# Patient Record
Sex: Female | Born: 1970
Health system: Southern US, Community
[De-identification: ages and names within clinical notes are randomized; demographics above are authoritative.]

## PROBLEM LIST (undated history)

## (undated) DIAGNOSIS — E079 Disorder of thyroid, unspecified: Secondary | ICD-10-CM

---

## 2000-08-31 ENCOUNTER — Other Ambulatory Visit: Admission: RE | Admit: 2000-08-31 | Discharge: 2000-08-31 | Payer: Self-pay | Admitting: *Deleted

## 2001-09-03 ENCOUNTER — Other Ambulatory Visit: Admission: RE | Admit: 2001-09-03 | Discharge: 2001-09-03 | Payer: Self-pay | Admitting: *Deleted

## 2001-10-05 ENCOUNTER — Encounter: Payer: Self-pay | Admitting: Obstetrics and Gynecology

## 2001-10-05 ENCOUNTER — Inpatient Hospital Stay (HOSPITAL_COMMUNITY): Admission: AD | Admit: 2001-10-05 | Discharge: 2001-10-05 | Payer: Self-pay | Admitting: Obstetrics and Gynecology

## 2001-10-06 ENCOUNTER — Encounter (INDEPENDENT_AMBULATORY_CARE_PROVIDER_SITE_OTHER): Payer: Self-pay

## 2001-10-06 ENCOUNTER — Ambulatory Visit (HOSPITAL_COMMUNITY): Admission: AD | Admit: 2001-10-06 | Discharge: 2001-10-06 | Payer: Self-pay | Admitting: Obstetrics and Gynecology

## 2002-04-19 ENCOUNTER — Encounter: Payer: Self-pay | Admitting: Obstetrics and Gynecology

## 2002-04-19 ENCOUNTER — Encounter: Admission: RE | Admit: 2002-04-19 | Discharge: 2002-04-19 | Payer: Self-pay | Admitting: Obstetrics and Gynecology

## 2002-10-08 ENCOUNTER — Encounter: Payer: Self-pay | Admitting: Obstetrics and Gynecology

## 2002-10-08 ENCOUNTER — Ambulatory Visit (HOSPITAL_COMMUNITY): Admission: RE | Admit: 2002-10-08 | Discharge: 2002-10-08 | Payer: Self-pay | Admitting: Obstetrics and Gynecology

## 2002-10-08 ENCOUNTER — Other Ambulatory Visit: Admission: RE | Admit: 2002-10-08 | Discharge: 2002-10-08 | Payer: Self-pay | Admitting: Obstetrics and Gynecology

## 2002-12-26 ENCOUNTER — Inpatient Hospital Stay (HOSPITAL_COMMUNITY): Admission: AD | Admit: 2002-12-26 | Discharge: 2002-12-26 | Payer: Self-pay | Admitting: Obstetrics and Gynecology

## 2003-01-30 ENCOUNTER — Ambulatory Visit (HOSPITAL_COMMUNITY): Admission: RE | Admit: 2003-01-30 | Discharge: 2003-01-30 | Payer: Self-pay | Admitting: Obstetrics and Gynecology

## 2003-01-30 ENCOUNTER — Encounter (INDEPENDENT_AMBULATORY_CARE_PROVIDER_SITE_OTHER): Payer: Self-pay | Admitting: Specialist

## 2003-12-02 ENCOUNTER — Other Ambulatory Visit: Admission: RE | Admit: 2003-12-02 | Discharge: 2003-12-02 | Payer: Self-pay | Admitting: Obstetrics and Gynecology

## 2004-05-18 ENCOUNTER — Other Ambulatory Visit: Admission: RE | Admit: 2004-05-18 | Discharge: 2004-05-18 | Payer: Self-pay | Admitting: Obstetrics and Gynecology

## 2004-09-21 ENCOUNTER — Ambulatory Visit (HOSPITAL_COMMUNITY): Admission: RE | Admit: 2004-09-21 | Discharge: 2004-09-21 | Payer: Self-pay | Admitting: Obstetrics and Gynecology

## 2004-10-18 ENCOUNTER — Inpatient Hospital Stay (HOSPITAL_COMMUNITY): Admission: AD | Admit: 2004-10-18 | Discharge: 2004-10-21 | Payer: Self-pay | Admitting: Obstetrics and Gynecology

## 2004-10-19 ENCOUNTER — Ambulatory Visit: Payer: Self-pay | Admitting: *Deleted

## 2004-10-19 ENCOUNTER — Encounter (INDEPENDENT_AMBULATORY_CARE_PROVIDER_SITE_OTHER): Payer: Self-pay | Admitting: Specialist

## 2004-10-22 ENCOUNTER — Encounter: Admission: RE | Admit: 2004-10-22 | Discharge: 2004-11-20 | Payer: Self-pay | Admitting: Obstetrics and Gynecology

## 2004-11-21 ENCOUNTER — Encounter: Admission: RE | Admit: 2004-11-21 | Discharge: 2004-12-21 | Payer: Self-pay | Admitting: Obstetrics and Gynecology

## 2004-11-29 ENCOUNTER — Other Ambulatory Visit: Admission: RE | Admit: 2004-11-29 | Discharge: 2004-11-29 | Payer: Self-pay | Admitting: Obstetrics and Gynecology

## 2004-12-22 ENCOUNTER — Encounter: Admission: RE | Admit: 2004-12-22 | Discharge: 2005-01-20 | Payer: Self-pay | Admitting: Obstetrics and Gynecology

## 2005-01-21 ENCOUNTER — Encounter: Admission: RE | Admit: 2005-01-21 | Discharge: 2005-02-20 | Payer: Self-pay | Admitting: Obstetrics and Gynecology

## 2005-02-21 ENCOUNTER — Encounter: Admission: RE | Admit: 2005-02-21 | Discharge: 2005-03-23 | Payer: Self-pay | Admitting: Obstetrics and Gynecology

## 2005-03-24 ENCOUNTER — Encounter: Admission: RE | Admit: 2005-03-24 | Discharge: 2005-04-20 | Payer: Self-pay | Admitting: Obstetrics and Gynecology

## 2005-04-21 ENCOUNTER — Encounter: Admission: RE | Admit: 2005-04-21 | Discharge: 2005-05-21 | Payer: Self-pay | Admitting: Obstetrics and Gynecology

## 2005-05-22 ENCOUNTER — Encounter: Admission: RE | Admit: 2005-05-22 | Discharge: 2005-06-07 | Payer: Self-pay | Admitting: Obstetrics and Gynecology

## 2006-08-16 ENCOUNTER — Encounter: Admission: RE | Admit: 2006-08-16 | Discharge: 2006-08-16 | Payer: Self-pay | Admitting: Obstetrics and Gynecology

## 2006-10-06 ENCOUNTER — Encounter: Admission: RE | Admit: 2006-10-06 | Discharge: 2006-10-06 | Payer: Self-pay | Admitting: Obstetrics and Gynecology

## 2007-04-10 ENCOUNTER — Ambulatory Visit (HOSPITAL_COMMUNITY): Admission: RE | Admit: 2007-04-10 | Discharge: 2007-04-10 | Payer: Self-pay | Admitting: Obstetrics and Gynecology

## 2007-12-20 ENCOUNTER — Inpatient Hospital Stay (HOSPITAL_COMMUNITY): Admission: AD | Admit: 2007-12-20 | Discharge: 2007-12-22 | Payer: Self-pay | Admitting: Obstetrics and Gynecology

## 2007-12-24 ENCOUNTER — Encounter: Admission: RE | Admit: 2007-12-24 | Discharge: 2008-01-22 | Payer: Self-pay | Admitting: Obstetrics and Gynecology

## 2007-12-26 ENCOUNTER — Ambulatory Visit: Admission: RE | Admit: 2007-12-26 | Discharge: 2007-12-26 | Payer: Self-pay | Admitting: Obstetrics and Gynecology

## 2008-01-23 ENCOUNTER — Encounter: Admission: RE | Admit: 2008-01-23 | Discharge: 2008-02-22 | Payer: Self-pay | Admitting: Obstetrics and Gynecology

## 2008-02-23 ENCOUNTER — Encounter: Admission: RE | Admit: 2008-02-23 | Discharge: 2008-03-24 | Payer: Self-pay | Admitting: Obstetrics and Gynecology

## 2008-03-25 ENCOUNTER — Encounter: Admission: RE | Admit: 2008-03-25 | Discharge: 2008-04-21 | Payer: Self-pay | Admitting: Obstetrics and Gynecology

## 2008-04-22 ENCOUNTER — Encounter: Admission: RE | Admit: 2008-04-22 | Discharge: 2008-05-08 | Payer: Self-pay | Admitting: Obstetrics and Gynecology

## 2010-06-18 NOTE — H&P (Signed)
NAME:  Desiree Pham, Desiree Pham                         ACCOUNT NO.:  [id number]   MEDICAL RECORD NO.:  [id number]                   PATIENT TYPE:  AMB   LOCATION:  SDC                                  FACILITY:  [facility]   PHYSICIAN:  Juluis Mire, M.D.                DATE OF BIRTH:  Aug 03, 1970   DATE OF ADMISSION:  DATE OF DISCHARGE:                                HISTORY & PHYSICAL   The patient is a 40 year old gravida 2, para 0, abortus 1, married white  female whose last menstrual period was October 20, giving her an estimated  gestational age of [redacted] weeks.  Presents for D&E for management of nonviable  first trimester pregnancy.   In relation to the present admission, the patient reported increasing  spotting.  Was seen in the office on __________ 29, where ultrasound  revealed an intrauterine fetal pole consistent with 6 weeks 3 days.  No  heart activity was seen, consistent with a nonviable first trimester  pregnancy.  The patient now presents for D&E for management.   In terms of allergies, she is allergic to CODEINE, which causes nausea and  vomiting.   MEDICATIONS:  Prenatal vitamins.   PRENATAL LABORATORY DATA:  She is A negative.  RhoGAM will be required.  It  is of note, she has had some bleeding and received RhoGAM, evidently over  Thanksgiving.  We will have to check the titers.   PAST MEDICAL HISTORY:  Usual childhood diseases with no significant  sequelae.   PAST SURGICAL HISTORY:  She has had a previous D&E in September 2003 for  nonviable pregnancy.   FAMILY HISTORY:  Noncontributory.   SOCIAL HISTORY:  No tobacco or alcohol use.   REVIEW OF SYSTEMS:  Noncontributory.   PHYSICAL EXAMINATION:  VITAL SIGNS:  The patient is afebrile with stable.  vital signs.  HEENT:  Patient is normocephalic.  Pupils equal, round, and reactive to  light and accommodation.  Extraocular movements were intact.  Sclerae and  conjunctivae clear.  Oropharynx clear.  NECK:  Without  thyromegaly.  BREASTS:  No discrete masses.  CHEST:  Lungs clear.  CARDIAC:  Regular rhythm and rate without murmurs or gallops.  ABDOMEN:  Benign.  PELVIC:  Normal external genitalia.  Vaginal mucosa is clear.  Mild bleeding  is noted.  Uterus approximately eight weeks in size, adnexa unremarkable.  EXTREMITIES:  Trace edema.  NEUROLOGIC:  Grossly within normal limits.   IMPRESSION:  Nonviable first trimester pregnancy.   PLAN:  The patient will undergo dilatation and evacuation.  Risks of surgery  have been discussed, including the risk of infection, the risk of continued  bleeding that could require repeat D&C, risk of hemorrhage that could  require transfusion or possible hysterectomy, risk of uterine perforation  that could lead to injury to adjacent organs requiring diagnostic  laparoscopy with possible exploratory laparotomy, the risk of deep venous  thrombosis and pulmonary embolus.  The patient expressed an understanding of  the indications and risks.  We will send tissue for genetic studies.                                               Juluis Mire, M.D.    JSM/MEDQ  D:  01/30/2003  T:  01/30/2003  Job:  [id number]

## 2010-06-18 NOTE — Op Note (Signed)
NAMESHATORIA, STOOKSBURY               ACCOUNT NO.:  [id number]   MEDICAL RECORD NO.:  [id number]          PATIENT TYPE:  INP   LOCATION:  9319                          FACILITY:  [facility]   PHYSICIAN:  Duke Salvia. Marcelle Overlie, M.D.DATE OF BIRTH:  Jul 16, 1970   DATE OF PROCEDURE:  10/19/2004  DATE OF DISCHARGE:                                 OPERATIVE REPORT   DELIVERY NOTE:  This patient is a 31+ weeks, PPROM, laboring patient.  I was  called to delivery because not only was she starting the crown but also  because of some variable decelerations with intermittent recovery.  After  prepping and draping, her catheter had been recently removed, was noted to  be straight OA.  You could see the scalp easily without spreading the labia.  Because of the variable decelerations, a decision made to proceed with V-  assisted delivery.  The Kiwi was applied and moderate traction effort  coordinated with internal pushing x1 effort only to effect easy delivery of  a female, the infant was suctioned, the cord was noted to be around the leg  twice.  The infant was suctioned, cord clamped, and passed to the NICU team,  which was in attendance.  Cord pH was 7.15.  The placenta was delivered  spontaneously intact and was sent to pathology.  Pitocin was given at that  point.  EBL 350.  There was a superficial second degree perineal laceration  that was repaired with 3-0 Vicryl Rapide suture.  Mother and baby doing well  at that point, Apgars were 7 and 7.      Richard M. Marcelle Overlie, M.D.  Electronically Signed     RMH/MEDQ  D:  10/19/2004  T:  10/20/2004  Job:  [id number]

## 2010-11-02 LAB — CBC
MCHC: 33.6
Platelets: 162
RDW: 13.6
WBC: 14.4 — ABNORMAL HIGH

## 2010-11-02 LAB — RH IMMUNE GLOB WKUP(>/=20WKS)(NOT WOMEN'S HOSP)

## 2010-11-02 LAB — RPR: RPR Ser Ql: NONREACTIVE

## 2012-03-07 ENCOUNTER — Other Ambulatory Visit: Payer: Self-pay | Admitting: Obstetrics and Gynecology

## 2012-08-04 ENCOUNTER — Emergency Department (HOSPITAL_COMMUNITY)
Admission: EM | Admit: 2012-08-04 | Discharge: 2012-08-04 | Disposition: A | Discharge status: Home / Self Care | Payer: BC Managed Care – PPO | Attending: Emergency Medicine | Admitting: Emergency Medicine

## 2012-08-04 ENCOUNTER — Encounter (HOSPITAL_COMMUNITY): Payer: Self-pay | Admitting: Emergency Medicine

## 2012-08-04 DIAGNOSIS — Z23 Encounter for immunization: Secondary | ICD-10-CM | POA: Insufficient documentation

## 2012-08-04 DIAGNOSIS — Y92410 Unspecified street and highway as the place of occurrence of the external cause: Secondary | ICD-10-CM | POA: Insufficient documentation

## 2012-08-04 DIAGNOSIS — E079 Disorder of thyroid, unspecified: Secondary | ICD-10-CM | POA: Insufficient documentation

## 2012-08-04 DIAGNOSIS — S81009A Unspecified open wound, unspecified knee, initial encounter: Secondary | ICD-10-CM | POA: Insufficient documentation

## 2012-08-04 DIAGNOSIS — Z79899 Other long term (current) drug therapy: Secondary | ICD-10-CM | POA: Insufficient documentation

## 2012-08-04 DIAGNOSIS — W540XXA Bitten by dog, initial encounter: Secondary | ICD-10-CM | POA: Insufficient documentation

## 2012-08-04 DIAGNOSIS — Y9301 Activity, walking, marching and hiking: Secondary | ICD-10-CM | POA: Insufficient documentation

## 2012-08-04 DIAGNOSIS — S81851A Open bite, right lower leg, initial encounter: Secondary | ICD-10-CM

## 2012-08-04 HISTORY — DX: Disorder of thyroid, unspecified: E07.9

## 2012-08-04 MED ORDER — TRAMADOL HCL 50 MG PO TABS
100.0000 mg | ORAL_TABLET | Freq: Once | ORAL | Status: AC
  Administered 2012-08-04: 100 mg via ORAL
  Filled 2012-08-04: qty 2

## 2012-08-04 MED ORDER — ACETAMINOPHEN 325 MG PO TABS
650.0000 mg | ORAL_TABLET | Freq: Once | ORAL | Status: AC
  Administered 2012-08-04: 650 mg via ORAL
  Filled 2012-08-04: qty 2

## 2012-08-04 MED ORDER — TRAMADOL HCL 50 MG PO TABS: 50.0000 mg | ORAL_TABLET | Freq: Four times a day (QID) | ORAL | Status: DC | PRN

## 2012-08-04 MED ORDER — RABIES VACCINE, PCEC IM SUSR
1.0000 mL | Freq: Once | INTRAMUSCULAR | Status: AC
  Administered 2012-08-04: 1 mL via INTRAMUSCULAR
  Filled 2012-08-04: qty 1

## 2012-08-04 MED ORDER — TRAMADOL HCL 50 MG PO TABS: 50.0000 mg | ORAL_TABLET | Freq: Once | ORAL | Status: DC

## 2012-08-04 MED ORDER — RABIES IMMUNE GLOBULIN 150 UNIT/ML IM INJ
20.0000 [IU]/kg | INJECTION | Freq: Once | INTRAMUSCULAR | Status: AC
  Administered 2012-08-04: 1050 [IU]
  Filled 2012-08-04: qty 8

## 2012-08-04 NOTE — ED Provider Notes (Signed)
   History    CSN: [id number] Arrival date & time 08/04/12  1321  First MD Initiated Contact with Patient 08/04/12 1330     Chief Complaint  Patient presents with  . Animal Bite   (Consider location/radiation/quality/duration/timing/severity/associated sxs/prior Treatment) The history is provided by the patient and medical records.   Pt presents to the ED for a dog bite on her right lower leg.  Pt states she went for a walk this morning and was bitten by a dog that she thinks belongs to someone on her street but is unsure.  Dogs vaccination status is unknown.  Animal control was contacted and they are looking for the dog.   Pt was seen at [facility] and given a tetanus shot and prescription for augmentin.  Sent to the ED for rabies vaccinations.  Wound bandaged and bleeding well controlled on arrival.  Past Medical History  Diagnosis Date  . Thyroid disease    History reviewed. No pertinent past surgical history. No family history on file. History  Substance Use Topics  . Smoking status: Not on file  . Smokeless tobacco: Not on file  . Alcohol Use: Yes   OB History   Grav Para Term Preterm Abortions TAB SAB Ect Mult Living                 Review of Systems  Skin: Positive for wound.  All other systems reviewed and are negative.    Allergies  Review of patient's allergies indicates not on file.  Home Medications  No current outpatient prescriptions on file. BP 121/54  Pulse 62  Temp(Src) 97.9 F (36.6 C) (Oral)  Resp 20  SpO2 97%  LMP 07/19/2012  Physical Exam  Nursing note and vitals reviewed. Constitutional: She is oriented to person, place, and time. She appears well-developed and well-nourished.  HENT:  Head: Normocephalic and atraumatic.  Eyes: Conjunctivae and EOM are normal.  Neck: Normal range of motion. Neck supple.  Cardiovascular: Normal rate, regular rhythm, normal heart sounds and intact distal pulses.   Pulmonary/Chest: Effort normal and breath sounds  normal. No respiratory distress. She has no wheezes.  Musculoskeletal: Normal range of motion.       Legs: Neurological: She is alert and oriented to person, place, and time.  Skin: Skin is warm and dry. Laceration noted.  Psychiatric: She has a normal mood and affect.    ED Course  Procedures (including critical care time) Labs Reviewed - No data to display No results found.  1. Dog bite of lower leg, right, initial encounter   2. Need for immunization against rabies     MDM   Tetanus UTD (given at UC this morning).  Rabies vaccine and immune globulin given.  Pt given schedule for subsequent rabies vaccinations and FU for [facility] urgent care-- Advised if she does not complete series they may not be fully effective.  Instructed to take augmentin given to her at [facility] this morning.  Continue home wound care and monitor for signs of infection.  Rx tramadol.  Discussed plan with pt, she agreed.  Return precautions advised.  Garlon Hatchet, PA-C 08/04/12 1549

## 2012-08-04 NOTE — ED Notes (Signed)
Animal control called and will get in touch with the owner and will pick up dog tomorrow.

## 2012-08-04 NOTE — ED Provider Notes (Signed)
Medical screening examination/treatment/procedure(s) were performed by non-physician practitioner and as supervising physician I was immediately available for consultation/collaboration.   Kasie Leccese L Merline Perkin, MD 08/04/12 2047 

## 2012-08-04 NOTE — ED Notes (Signed)
Pt. Stated, I got biten by a dog this morning, went to UC and given a tetanus shot and told to come here for further evaluation.

## 2012-08-06 ENCOUNTER — Telehealth (HOSPITAL_COMMUNITY): Payer: Self-pay | Admitting: *Deleted

## 2012-08-06 NOTE — ED Notes (Signed)
Pt. called about scheduling her rabies shot. I told her we don't schedule any longer because we don't have a designated rabies Engineer, civil (consulting). I gave her the hours of operation and told her to come on the days she is scheduled. Day 3, 7 and 14. She said she was told to only come on day 7 and 14. I told her to come tomorrow and bring her d/c instructions. Kim RN called the flow manager for her rabies schedule. Vassie Moselle 08/06/2012

## 2012-08-06 NOTE — ED Notes (Signed)
I reviewed pt.'s d/c instructions and left message for pt. to call. The correct schedule is under vaccination after an exposure. Nancy Hertlein notified of pt.'s confusion about schedule. Vassie Moselle 08/06/2012

## 2012-08-07 ENCOUNTER — Emergency Department (HOSPITAL_COMMUNITY)
Admission: EM | Admit: 2012-08-07 | Discharge: 2012-08-07 | Disposition: A | Discharge status: Home / Self Care | Payer: BC Managed Care – PPO | Source: Home / Self Care

## 2012-08-07 ENCOUNTER — Encounter (HOSPITAL_COMMUNITY): Payer: Self-pay | Admitting: Emergency Medicine

## 2012-08-07 DIAGNOSIS — Z203 Contact with and (suspected) exposure to rabies: Secondary | ICD-10-CM

## 2012-08-07 MED ORDER — RABIES VACCINE, PCEC IM SUSR
INTRAMUSCULAR | Status: AC
  Filled 2012-08-07: qty 1

## 2012-08-07 MED ORDER — RABIES VACCINE, PCEC IM SUSR
1.0000 mL | Freq: Once | INTRAMUSCULAR | Status: AC
  Administered 2012-08-07: 1 mL via INTRAMUSCULAR

## 2012-08-07 NOTE — ED Notes (Signed)
Pt here for second rabies injection. Tolerated the first one well. No new problems. Pt is alert and oriened.

## 2012-08-11 ENCOUNTER — Emergency Department (HOSPITAL_COMMUNITY)
Admission: EM | Admit: 2012-08-11 | Discharge: 2012-08-11 | Disposition: A | Discharge status: Home / Self Care | Payer: BC Managed Care – PPO | Source: Home / Self Care

## 2012-08-11 ENCOUNTER — Encounter (HOSPITAL_COMMUNITY): Payer: Self-pay | Admitting: *Deleted

## 2012-08-11 DIAGNOSIS — Z203 Contact with and (suspected) exposure to rabies: Secondary | ICD-10-CM

## 2012-08-11 MED ORDER — RABIES VACCINE, PCEC IM SUSR
1.0000 mL | Freq: Once | INTRAMUSCULAR | Status: AC
  Administered 2012-08-11: 1 mL via INTRAMUSCULAR

## 2012-08-11 MED ORDER — RABIES VACCINE, PCEC IM SUSR
INTRAMUSCULAR | Status: AC
  Filled 2012-08-11: qty 1

## 2012-08-11 NOTE — ED Notes (Signed)
Presents for rabies injection.  No c/o's.

## 2012-08-11 NOTE — ED Notes (Signed)
Pt states she will be out of town for next dose - family member who is a physician is procuring vaccine and going to administer for pt.  Pt aware dose needs to be given on 7/19.

## 2012-11-07 ENCOUNTER — Telehealth (HOSPITAL_COMMUNITY): Payer: Self-pay | Admitting: *Deleted

## 2012-11-07 NOTE — ED Notes (Signed)
10/7  Pt. called and asked me to document that she had her 4 th rabies vaccine in [place].  I asked her if she had any documentation showing this.  She said yes. I asked her to fax it to me and I would see that it got on her record that she completed her rabies series.  10/8  Pt. faxed her record showing she got the rabies vaccine and a Tdap.  I added her medical record number to this and put it in the scan box. Vassie Moselle 11/07/2012

## 2013-03-14 ENCOUNTER — Other Ambulatory Visit: Payer: Self-pay | Admitting: Obstetrics and Gynecology

## 2013-10-30 ENCOUNTER — Ambulatory Visit: Payer: BC Managed Care – PPO | Attending: Family Medicine | Admitting: Physical Therapy

## 2013-10-30 DIAGNOSIS — R5381 Other malaise: Secondary | ICD-10-CM | POA: Diagnosis not present

## 2013-10-30 DIAGNOSIS — M25539 Pain in unspecified wrist: Secondary | ICD-10-CM | POA: Insufficient documentation

## 2013-10-30 DIAGNOSIS — IMO0001 Reserved for inherently not codable concepts without codable children: Secondary | ICD-10-CM | POA: Insufficient documentation

## 2013-11-06 ENCOUNTER — Ambulatory Visit: Payer: BC Managed Care – PPO | Attending: Family Medicine | Admitting: Physical Therapy

## 2013-11-06 DIAGNOSIS — M25521 Pain in right elbow: Secondary | ICD-10-CM | POA: Diagnosis not present

## 2013-11-06 DIAGNOSIS — Z5189 Encounter for other specified aftercare: Secondary | ICD-10-CM | POA: Insufficient documentation

## 2013-11-06 DIAGNOSIS — R5381 Other malaise: Secondary | ICD-10-CM | POA: Insufficient documentation

## 2013-11-07 ENCOUNTER — Ambulatory Visit: Payer: BC Managed Care – PPO | Admitting: Physical Therapy

## 2013-11-07 DIAGNOSIS — Z5189 Encounter for other specified aftercare: Secondary | ICD-10-CM | POA: Diagnosis not present

## 2013-11-11 ENCOUNTER — Ambulatory Visit: Payer: BC Managed Care – PPO | Admitting: Physical Therapy

## 2013-11-11 DIAGNOSIS — Z5189 Encounter for other specified aftercare: Secondary | ICD-10-CM | POA: Diagnosis not present

## 2013-11-14 ENCOUNTER — Ambulatory Visit: Payer: BC Managed Care – PPO | Admitting: Physical Therapy

## 2013-11-14 DIAGNOSIS — Z5189 Encounter for other specified aftercare: Secondary | ICD-10-CM | POA: Diagnosis not present

## 2013-11-18 ENCOUNTER — Ambulatory Visit: Payer: BC Managed Care – PPO | Admitting: Physical Therapy

## 2013-11-18 DIAGNOSIS — Z5189 Encounter for other specified aftercare: Secondary | ICD-10-CM | POA: Diagnosis not present

## 2013-11-21 ENCOUNTER — Ambulatory Visit: Payer: BC Managed Care – PPO | Admitting: Physical Therapy

## 2013-11-21 DIAGNOSIS — Z5189 Encounter for other specified aftercare: Secondary | ICD-10-CM | POA: Diagnosis not present

## 2013-11-25 ENCOUNTER — Encounter: Payer: BC Managed Care – PPO | Admitting: Physical Therapy

## 2013-11-26 ENCOUNTER — Ambulatory Visit: Payer: BC Managed Care – PPO | Admitting: Physical Therapy

## 2013-11-26 DIAGNOSIS — Z5189 Encounter for other specified aftercare: Secondary | ICD-10-CM | POA: Diagnosis not present

## 2013-11-28 ENCOUNTER — Encounter: Payer: BC Managed Care – PPO | Admitting: Physical Therapy

## 2013-12-02 ENCOUNTER — Ambulatory Visit: Payer: BC Managed Care – PPO | Admitting: Physical Therapy

## 2013-12-18 ENCOUNTER — Ambulatory Visit (INDEPENDENT_AMBULATORY_CARE_PROVIDER_SITE_OTHER): Payer: BC Managed Care – PPO | Admitting: Sports Medicine

## 2013-12-18 ENCOUNTER — Encounter: Payer: Self-pay | Admitting: Sports Medicine

## 2013-12-18 VITALS — BP 110/75 | HR 53 | Ht 66.0 in | Wt 109.0 lb

## 2013-12-18 DIAGNOSIS — M25521 Pain in right elbow: Secondary | ICD-10-CM

## 2013-12-18 DIAGNOSIS — M7711 Lateral epicondylitis, right elbow: Secondary | ICD-10-CM | POA: Diagnosis not present

## 2013-12-18 NOTE — Progress Notes (Signed)
Patient ID: Desiree SavoySamantha S Fatzinger, female   DOB: 07/29/1970, 43 y.o.   MRN: [id number]  Patient was referred courtesy of Dr. [place]az[place]  History of lateral elbow pain that was diagnosed as lateral epicondylitis. She has been to physical therapy and we have a summary of multiple sessions of therapy along with iontophoresis.  These give her temporary relief but she has not had any major improvement in the elbow since her symptoms started in May. She started getting symptoms in the elbow after she moved to a new house in [place] and was doing a lot of gardening.  Squeezing a clipper or using a trowel would cause elbow pain. Now she gets pain when holding a coffee cup, opening a door, picking up anything that weighs more than a few pounds with the right arm. She does some weight lifting exercise. However most of the exercises now are causing some pain in the elbow. Pushups also cause pain.  Medical issues, she is stable on treatment for hypothyroidism.  She does have a history of migraine headaches usually triggered by hormonal change.  They typically respond to Aleve and time.  Physical examination Thin female in no acute distress BP 110/75 mmHg  Pulse 53  Ht 5\' 6"  (1.676 m)  Wt 109 lb (49.442 kg)  BMI 17.60 kg/m2  Right elbow and right shoulder showed full range of motion Right elbow shows point tenderness at the insertion of the common extensor tendon on the lateral epicondyle There is no swelling There is no elbow hyperextension No redness  Resistance of the wrist to extension causes pain when the wrist is flexed Resistance of the fingers to extension cause less pain Full hand grip causes pain  Note she has increased redness over the fingers and MTP joints suggestive of Raynaud's  MSK ultrasound of the right elbow There is a small separation of tendon fibers from their insertion into the lateral epicondyle seen of the deep fibers of the common extensor tendon This is noted by hypoechoic  change and there is also calcification in the soft tissue The joint and radial head appear normal with no effusion or irregularity There is increased Doppler activity and neo-vessels seen in the common extensor tendon Findings are confirmed on longitudinal and transverse scans

## 2013-12-18 NOTE — Assessment & Plan Note (Signed)
She has failed a conservative course of treatment thus far  We will add a compression sleeve for her to use with activities Modify any weight lifting as this may be aggravating her condition  Arnica gel for topical application  Home exercise program to emphasize regaining eccentric strength Given a standard set of exercises  I would like to use the topical nitroglycerin but because of her history of migraine headaches she has a relative contraindication  We will recheck in 6 weeks and if not responding consider additional options

## 2014-01-13 ENCOUNTER — Emergency Department (HOSPITAL_BASED_OUTPATIENT_CLINIC_OR_DEPARTMENT_OTHER)
Admission: EM | Admit: 2014-01-13 | Discharge: 2014-01-13 | Disposition: A | Discharge status: Home / Self Care | Payer: BC Managed Care – PPO | Attending: Emergency Medicine | Admitting: Emergency Medicine

## 2014-01-13 ENCOUNTER — Emergency Department (HOSPITAL_BASED_OUTPATIENT_CLINIC_OR_DEPARTMENT_OTHER): Payer: BC Managed Care – PPO

## 2014-01-13 ENCOUNTER — Encounter (HOSPITAL_BASED_OUTPATIENT_CLINIC_OR_DEPARTMENT_OTHER): Payer: Self-pay

## 2014-01-13 DIAGNOSIS — S52552A Other extraarticular fracture of lower end of left radius, initial encounter for closed fracture: Secondary | ICD-10-CM | POA: Insufficient documentation

## 2014-01-13 DIAGNOSIS — Y9389 Activity, other specified: Secondary | ICD-10-CM | POA: Diagnosis not present

## 2014-01-13 DIAGNOSIS — Z791 Long term (current) use of non-steroidal anti-inflammatories (NSAID): Secondary | ICD-10-CM | POA: Diagnosis not present

## 2014-01-13 DIAGNOSIS — Y929 Unspecified place or not applicable: Secondary | ICD-10-CM | POA: Insufficient documentation

## 2014-01-13 DIAGNOSIS — Z79899 Other long term (current) drug therapy: Secondary | ICD-10-CM | POA: Diagnosis not present

## 2014-01-13 DIAGNOSIS — W19XXXA Unspecified fall, initial encounter: Secondary | ICD-10-CM

## 2014-01-13 DIAGNOSIS — S52502A Unspecified fracture of the lower end of left radius, initial encounter for closed fracture: Secondary | ICD-10-CM

## 2014-01-13 DIAGNOSIS — S6992XA Unspecified injury of left wrist, hand and finger(s), initial encounter: Secondary | ICD-10-CM | POA: Diagnosis present

## 2014-01-13 DIAGNOSIS — Y998 Other external cause status: Secondary | ICD-10-CM | POA: Diagnosis not present

## 2014-01-13 MED ORDER — NAPROXEN 500 MG PO TABS: 500.0000 mg | ORAL_TABLET | Freq: Two times a day (BID) | ORAL | Status: AC

## 2014-01-13 MED ORDER — TRAMADOL HCL 50 MG PO TABS: 50.0000 mg | ORAL_TABLET | Freq: Four times a day (QID) | ORAL | Status: AC | PRN

## 2014-01-13 NOTE — ED Provider Notes (Signed)
CSN: [id number]     Arrival date & time 01/13/14  0721 History   First MD Initiated Contact with Patient 01/13/14 [phone]     Chief Complaint  Patient presents with  . Wrist Pain     (Consider location/radiation/quality/duration/timing/severity/associated sxs/prior Treatment) Patient is a 43 y.o. female presenting with wrist pain. The history is provided by the patient.  Wrist Pain Pertinent negatives include no chest pain, no abdominal pain, no headaches and no shortness of breath.   status post fall off a scooter yesterday. With some abrasions to her right elbow and right hand but pain to the left wrist area that's been persistent. Was swelling. Pain 6 out of 10 sharp ache in nature. Patient denies any other injuries. Pain increases with movement of the left wrist. Patient's right hand dominant.  Past Medical History  Diagnosis Date  . Thyroid disease    History reviewed. No pertinent past surgical history. No family history on file. History  Substance Use Topics  . Smoking status: Never Smoker   . Smokeless tobacco: Not on file  . Alcohol Use: 0.0 oz/week    0 Not specified per week   OB History    No data available     Review of Systems  Constitutional: Negative for fever.  HENT: Negative for congestion.   Eyes: Negative for visual disturbance.  Respiratory: Negative for shortness of breath.   Cardiovascular: Negative for chest pain.  Gastrointestinal: Negative for abdominal pain.  Genitourinary: Negative for dysuria.  Musculoskeletal: Positive for joint swelling. Negative for back pain and neck pain.  Skin: Positive for wound. Negative for rash.  Neurological: Negative for headaches.  Hematological: Does not bruise/bleed easily.  Psychiatric/Behavioral: Negative for confusion.      Allergies  Codeine  Home Medications   Prior to Admission medications   Medication Sig Start Date End Date Taking? Authorizing Provider  5-Hydroxytryptophan (5-HTP PO) Take 1  tablet by mouth 2 (two) times daily.    Historical Provider, MD  CALCIUM PO Take 1 tablet by mouth daily.    Historical Provider, MD  fish oil-omega-3 fatty acids 1000 MG capsule Take 2 g by mouth daily.    Historical Provider, MD  levothyroxine (SYNTHROID, LEVOTHROID) 75 MCG tablet Take 75 mcg by mouth daily before breakfast.    Historical Provider, MD  MILK THISTLE PO Take 1 tablet by mouth daily.    Historical Provider, MD  minocycline (MINOCIN,DYNACIN) 100 MG capsule  09/26/13   Historical Provider, MD  Multiple Vitamins-Minerals (MULTIVITAMIN WITH MINERALS) tablet Take 1 tablet by mouth daily.    Historical Provider, MD  naproxen (NAPROSYN) 500 MG tablet Take 1 tablet (500 mg total) by mouth 2 (two) times daily. 01/13/14   Vanetta MuldersScott Torrance Frech, MD  PRESCRIPTION MEDICATION Take 1 [place] by mouth daily. mm6+    Historical Provider, MD  Probiotic Product (PROBIOTIC PO) Take 1 tablet by mouth daily.    Historical Provider, MD  traMADol (ULTRAM) 50 MG tablet Take 1 tablet (50 mg total) by mouth every 6 (six) hours as needed. 01/13/14   Vanetta MuldersScott Demika Langenderfer, MD  tretinoin (RETIN-A) 0.05 % cream  10/01/13   Historical Provider, MD   BP 123/47 mmHg  Pulse 64  Temp(Src) 98.2 F (36.8 C) (Oral)  Resp 16  Ht 5\' 6"  (1.676 m)  Wt 108 lb (48.988 kg)  BMI 17.44 kg/m2  SpO2 100%  LMP 01/10/2014 Physical Exam  Constitutional: She is oriented to person, place, and time. She appears well-developed and well-nourished. No  distress.  HENT:  Head: Normocephalic and atraumatic.  Mouth/Throat: Oropharynx is clear and moist.  Eyes: Conjunctivae and EOM are normal. Pupils are equal, round, and reactive to light.  Neck: Normal range of motion.  Cardiovascular: Normal rate and normal heart sounds.   No murmur heard. Pulmonary/Chest: Effort normal and breath sounds normal. No respiratory distress.  Abdominal: Soft. Bowel sounds are normal. There is no tenderness.  Musculoskeletal: Normal range of motion. She exhibits  tenderness.  Tenderness and swelling to the distal part of the forearm and wrist occluding snuffbox tenderness. Cap refill is 1 second to the fingers sensation intact. Good range of motion at the shoulder and the elbow and good range of motion of the fingers. Radial pulses 2+.  Neurological: She is alert and oriented to person, place, and time. No cranial nerve deficit. She exhibits abnormal muscle tone. Coordination normal.  Skin: Skin is warm.  Nursing note and vitals reviewed.   ED Course  Procedures (including critical care time) Labs Review Labs Reviewed - No data to display  Imaging Review Dg Wrist Complete Left  01/13/2014   CLINICAL DATA:  Left wrist pain, swelling/tenderness, status post fall  EXAM: LEFT WRIST - COMPLETE 3+ VIEW  COMPARISON:  None.  FINDINGS: Transverse distal radial fracture without intra-articular extension.  The joint spaces are preserved.  The visualized soft tissues are unremarkable.  IMPRESSION: Transverse distal radial fracture.   Electronically Signed   By: Charline BillsSriyesh  Krishnan M.D.   On: 01/13/2014 07:41     EKG Interpretation None      MDM   Final diagnoses:  Fall  Distal radius fracture, left, closed, initial encounter    Patient with fall nondisplaced distal radius fracture. Will be splinted in provided with a sling. Follow-up provided with sports medicine as well as hand surgery. Most likely sports medicine will be able to handle the fracture since it does not require any reduction. No other significant injuries. Patient is right-hand dominant.    Vanetta MuldersScott Marki Frede, MD 01/13/14 [phone]

## 2014-01-13 NOTE — ED Notes (Signed)
Pt sts she fell of childs scooter yesterday. C/o pain and swelling to left wrist.

## 2014-01-13 NOTE — Discharge Instructions (Signed)
Cast or Splint Care °Casts and splints support injured limbs and keep bones from moving while they heal. It is important to care for your cast or splint at home.   °HOME CARE INSTRUCTIONS °· Keep the cast or splint uncovered during the drying period. It can take 24 to 48 hours to dry if it is made of plaster. A fiberglass cast will dry in less than 1 hour. °· Do not rest the cast on anything harder than a pillow for the first 24 hours. °· Do not put weight on your injured limb or apply pressure to the cast until your health care provider gives you permission. °· Keep the cast or splint dry. Wet casts or splints can lose their shape and may not support the limb as well. A wet cast that has lost its shape can also create harmful pressure on your skin when it dries. Also, wet skin can become infected. °¨ Cover the cast or splint with a plastic bag when bathing or when out in the rain or snow. If the cast is on the trunk of the body, take sponge baths until the cast is removed. °¨ If your cast does become wet, dry it with a towel or a blow dryer on the cool setting only. °· Keep your cast or splint clean. Soiled casts may be wiped with a moistened cloth. °· Do not place any hard or soft foreign objects under your cast or splint, such as cotton, toilet paper, lotion, or powder. °· Do not try to scratch the skin under the cast with any object. The object could get stuck inside the cast. Also, scratching could lead to an infection. If itching is a problem, use a blow dryer on a cool setting to relieve discomfort. °· Do not trim or cut your cast or remove padding from inside of it. °· Exercise all joints next to the injury that are not immobilized by the cast or splint. For example, if you have a long leg cast, exercise the hip joint and toes. If you have an arm cast or splint, exercise the shoulder, elbow, thumb, and fingers. °· Elevate your injured arm or leg on 1 or 2 pillows for the first 1 to 3 days to decrease  swelling and pain. It is best if you can comfortably elevate your cast so it is higher than your heart. °SEEK MEDICAL CARE IF:  °· Your cast or splint cracks. °· Your cast or splint is too tight or too loose. °· You have unbearable itching inside the cast. °· Your cast becomes wet or develops a soft spot or area. °· You have a bad smell coming from inside your cast. °· You get an object stuck under your cast. °· Your skin around the cast becomes red or raw. °· You have new pain or worsening pain after the cast has been applied. °SEEK IMMEDIATE MEDICAL CARE IF:  °· You have fluid leaking through the cast. °· You are unable to move your fingers or toes. °· You have discolored (blue or white), cool, painful, or very swollen fingers or toes beyond the cast. °· You have tingling or numbness around the injured area. °· You have severe pain or pressure under the cast. °· You have any difficulty with your breathing or have shortness of breath. °· You have chest pain. °Document Released: 01/15/2000 Document Revised: 11/07/2012 Document Reviewed: 07/26/2012 °[employer]® Patient Information ©2015 [employer]. This information is not intended to replace advice given to you by your health care   provider. Make sure you discuss any questions you have with your health care provider.  Keep splint on and in place. Use sling as needed for your comfort. Elevate left arm is much as possible for the next 2 days. Call sports medicine for follow-up. As we discussed she also have the name of Dr. Janee Mornhompson hand surgeon to follow-up with if needed. Take the Naprosyn on a regular basis. Supplement with the tramadol as needed. You can take both together.

## 2014-01-13 NOTE — ED Notes (Signed)
Patient transported to X-ray 

## 2014-01-17 ENCOUNTER — Encounter: Payer: BC Managed Care – PPO | Admitting: Family Medicine

## 2014-01-17 ENCOUNTER — Encounter (INDEPENDENT_AMBULATORY_CARE_PROVIDER_SITE_OTHER): Payer: Self-pay

## 2014-01-17 ENCOUNTER — Encounter: Payer: Self-pay | Admitting: Family Medicine

## 2014-01-20 NOTE — Progress Notes (Signed)
This encounter was created in error - please disregard.

## 2014-01-21 ENCOUNTER — Encounter (INDEPENDENT_AMBULATORY_CARE_PROVIDER_SITE_OTHER): Payer: Self-pay

## 2014-01-21 ENCOUNTER — Ambulatory Visit (INDEPENDENT_AMBULATORY_CARE_PROVIDER_SITE_OTHER): Payer: BC Managed Care – PPO | Admitting: Family Medicine

## 2014-01-21 ENCOUNTER — Ambulatory Visit (HOSPITAL_BASED_OUTPATIENT_CLINIC_OR_DEPARTMENT_OTHER)
Admission: RE | Admit: 2014-01-21 | Discharge: 2014-01-21 | Disposition: A | Discharge status: Home / Self Care | Payer: BC Managed Care – PPO | Source: Ambulatory Visit | Attending: Family Medicine | Admitting: Family Medicine

## 2014-01-21 VITALS — BP 99/66 | HR 59 | Ht 66.0 in | Wt 108.0 lb

## 2014-01-21 DIAGNOSIS — S52502A Unspecified fracture of the lower end of left radius, initial encounter for closed fracture: Secondary | ICD-10-CM | POA: Insufficient documentation

## 2014-01-21 DIAGNOSIS — X58XXXA Exposure to other specified factors, initial encounter: Secondary | ICD-10-CM | POA: Insufficient documentation

## 2014-01-21 DIAGNOSIS — M25532 Pain in left wrist: Secondary | ICD-10-CM

## 2014-02-03 DIAGNOSIS — S52502A Unspecified fracture of the lower end of left radius, initial encounter for closed fracture: Secondary | ICD-10-CM | POA: Insufficient documentation
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
## 2014-02-03 NOTE — Assessment & Plan Note (Signed)
nondisplaced.  Should heal well with conservative measures.  No additional displacement on todays radiographs.  Short arm cast placed.  NSAIDs if needed.  Declined stronger pain medication.  F/u in 2 weeks to remove cast, repeat x-rays.  Expect 6 weeks to heal.

## 2014-02-04 ENCOUNTER — Ambulatory Visit (HOSPITAL_BASED_OUTPATIENT_CLINIC_OR_DEPARTMENT_OTHER)
Admission: RE | Admit: 2014-02-04 | Discharge: 2014-02-04 | Disposition: A | Discharge status: Home / Self Care | Payer: 59 | Source: Ambulatory Visit | Attending: Family Medicine | Admitting: Family Medicine

## 2014-02-04 ENCOUNTER — Ambulatory Visit (INDEPENDENT_AMBULATORY_CARE_PROVIDER_SITE_OTHER): Payer: 59 | Admitting: Family Medicine

## 2014-02-04 ENCOUNTER — Encounter: Payer: Self-pay | Admitting: Family Medicine

## 2014-02-04 VITALS — BP 106/71 | HR 63 | Ht 66.0 in | Wt 108.0 lb

## 2014-02-04 DIAGNOSIS — X58XXXD Exposure to other specified factors, subsequent encounter: Secondary | ICD-10-CM | POA: Insufficient documentation

## 2014-02-04 DIAGNOSIS — S52502G Unspecified fracture of the lower end of left radius, subsequent encounter for closed fracture with delayed healing: Secondary | ICD-10-CM | POA: Insufficient documentation

## 2014-02-04 DIAGNOSIS — M25532 Pain in left wrist: Secondary | ICD-10-CM | POA: Diagnosis present

## 2014-02-04 DIAGNOSIS — S52502D Unspecified fracture of the lower end of left radius, subsequent encounter for closed fracture with routine healing: Secondary | ICD-10-CM

## 2014-02-04 DIAGNOSIS — S6992XD Unspecified injury of left wrist, hand and finger(s), subsequent encounter: Secondary | ICD-10-CM

## 2014-02-04 NOTE — Patient Instructions (Signed)
Wear wrist brace through January 24th. Ok to take off to wash the area. At this point this should heal without any issues unless you were to fall on it. Do not do any bicep curls, weightlifting, pushups until January 24th. Follow up with me around this date for final appointment.

## 2014-02-05 NOTE — Assessment & Plan Note (Signed)
nondisplaced.  Radiographs show no displacement and progression of healing.  Clinically she has significantly improved as well.  Discussed options and will go ahead with wrist brace regularly at this point to wear until she is 6 weeks out from initial injury.  F/u at that point for reassessment.  Consider radiographs at that time if she's having any pain.
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
## 2014-02-06 ENCOUNTER — Ambulatory Visit (INDEPENDENT_AMBULATORY_CARE_PROVIDER_SITE_OTHER): Payer: 59 | Admitting: Sports Medicine

## 2014-02-06 ENCOUNTER — Encounter: Payer: Self-pay | Admitting: Sports Medicine

## 2014-02-06 VITALS — BP 110/72 | HR 53 | Ht 66.0 in | Wt 107.0 lb

## 2014-02-06 DIAGNOSIS — M7711 Lateral epicondylitis, right elbow: Secondary | ICD-10-CM

## 2014-02-06 DIAGNOSIS — S52502D Unspecified fracture of the lower end of left radius, subsequent encounter for closed fracture with routine healing: Secondary | ICD-10-CM

## 2014-02-06 MED ORDER — METHYLPREDNISOLONE ACETATE 40 MG/ML IJ SUSP
40.0000 mg | Freq: Once | INTRAMUSCULAR | Status: AC
  Administered 2014-02-06: 40 mg via INTRA_ARTICULAR
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
## 2014-02-24 ENCOUNTER — Ambulatory Visit (INDEPENDENT_AMBULATORY_CARE_PROVIDER_SITE_OTHER): Payer: 59 | Admitting: Family Medicine

## 2014-02-24 ENCOUNTER — Ambulatory Visit (HOSPITAL_BASED_OUTPATIENT_CLINIC_OR_DEPARTMENT_OTHER)
Admission: RE | Admit: 2014-02-24 | Discharge: 2014-02-24 | Disposition: A | Discharge status: Home / Self Care | Payer: 59 | Source: Ambulatory Visit | Attending: Family Medicine | Admitting: Family Medicine

## 2014-02-24 ENCOUNTER — Encounter: Payer: Self-pay | Admitting: Family Medicine

## 2014-02-24 VITALS — BP 104/68 | HR 64 | Ht 66.0 in | Wt 108.0 lb

## 2014-02-24 DIAGNOSIS — S52502D Unspecified fracture of the lower end of left radius, subsequent encounter for closed fracture with routine healing: Secondary | ICD-10-CM

## 2014-02-24 DIAGNOSIS — X58XXXD Exposure to other specified factors, subsequent encounter: Secondary | ICD-10-CM | POA: Diagnosis not present

## 2014-02-24 DIAGNOSIS — S5292XD Unspecified fracture of left forearm, subsequent encounter for closed fracture with routine healing: Secondary | ICD-10-CM

## 2014-02-24 NOTE — Patient Instructions (Signed)
Wear the wrist brace for 2 more weeks then come out of this and do motion exercises twice a day. Ok to ice if needed. Call or follow up with me in 4-5 weeks if you're still having problems.

## 2014-02-25 NOTE — Assessment & Plan Note (Signed)
Left distal radius fracture - nondisplaced.  Radiographs show no displacement - they are read as unchanged however there is a definite increase in brightness/density at area of fracture without visible callus.  She has also clinically improved and a nonunion here would be unusual.  Recommended she wear brace for 2 more weeks then start easy motion exercises.  F/u in 4-5 weeks unless completely pain free.
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
## 2014-03-13 ENCOUNTER — Encounter: Payer: Self-pay | Admitting: Sports Medicine

## 2014-03-13 ENCOUNTER — Ambulatory Visit (INDEPENDENT_AMBULATORY_CARE_PROVIDER_SITE_OTHER): Payer: 59 | Admitting: Sports Medicine

## 2014-03-13 VITALS — BP 97/65 | HR 59 | Ht 66.0 in | Wt 106.0 lb

## 2014-03-13 DIAGNOSIS — M7711 Lateral epicondylitis, right elbow: Secondary | ICD-10-CM

## 2014-03-13 DIAGNOSIS — M654 Radial styloid tenosynovitis [de Quervain]: Secondary | ICD-10-CM

## 2014-03-13 DIAGNOSIS — S52502D Unspecified fracture of the lower end of left radius, subsequent encounter for closed fracture with routine healing: Secondary | ICD-10-CM

## 2014-03-13 MED ORDER — DICLOFENAC SODIUM 1 % TD GEL: 2.0000 g | Freq: Four times a day (QID) | TRANSDERMAL | Status: AC

## 2014-03-13 NOTE — Progress Notes (Signed)
  Desiree Pham - 44 y.o. female MRN [id number]  Date of birth: 03/17/1970  SUBJECTIVE: CC: Right elbow pain and left wrist pain, follow-up HPI: Patient reports her right elbow pain is significantly improved following therapeutic exercises prescribed at last visit. She has resumed normal activities with his elbow and is having only minimal complaints.  Her left wrist has been followed by Dr. Pearletha ForgeHudnall,  she is having pain with ulnar deviation of her wrist and grip strength. She is using topical arnica gel but is having difficulty with everyday activities. She denies any numbness, tingling.  ROS: per HPI  HISTORY:  Past Medical, Surgical, Social, and Family History reviewed & updated per EMR.  Pertinent Historical Findings include:  reports that she has never smoked. She does not have any smokeless tobacco history on file. Historical Data Reviewed: 02/24/2014: X-ray left wrist: Persistent nondisplaced impacted left distal radius fracture without significant callus formation. Dr. Pearletha ForgeHudnall recommended but bracing for an additional 2 weeks then begin easy motion exercises.   OBJECTIVE:  VS:   HT:5\' 6"  (167.6 cm)   WT:106 lb (48.081 kg)  BMI:17.1          BP:97/65 mmHg  HR:(!) 59bpm  TEMP: ( )  RESP:   PHYSICAL EXAM:  GENERAL: No acute distress alert and appropriately interactive. LEFT WRIST: No significant swelling bruising or deformity. She has marked tenderness palpation over the first dorsal compartment. Positive Finkelstein. No anatomic snuffbox tenderness. Grip strength 5/5, radial pulse 2+/4 RIGHT ELBOW: Mild tenderness palpation over lateral epicondyles. No significant pain with resisted wrist extension or supination.  DATA OBTAINED DURING VISIT: LIMITED MSK ULTRASOUND OF RIGHT ELBOW: Findings:  Small persistent hypoechoic change at the inferior and distal aspect of the common extensor tendon origin. No significant thickening of the common extensor tendon. Impression: Healing chronic  lateral epicondylosis  LIMITED MSK ULTRASOUND OF LEFT WRIST: Findings:  Significant narrowing of the first dorsal compartment due to healing fracture. There is moderate callus formation but impingement into the first dorsal compartment. She does have fluid around the tendon both proximally and distally to the callus formation. Impression: Tommi RumpsDe Quervain's tenosynovitis secondary to extrinsic compression of first dorsal compartment with healing callus.   ASSESSMENT: 1. Closed fracture of left distal radius, with routine healing, subsequent encounter   2. Lateral epicondylitis of right elbow   3. De Quervain's tenosynovitis, left    PLAN: See problem based charting & AVS for additional documentation.  Body Helix left wrist compression sleeve provided today. Discussed using during activity as well as for the first 2 hours following activity and PRN.  Gentle range of motion exercises for the left wrist.  Prescription for Voltaren Gel  Continue at least 3 days per week of lateral epicondylitis exercises > Return in about 6 weeks (around 04/24/2014).  {

## 2014-03-20 ENCOUNTER — Other Ambulatory Visit: Payer: Self-pay | Admitting: Obstetrics and Gynecology

## 2014-03-21 LAB — CYTOLOGY - PAP

## 2015-09-09 IMAGING — CR DG WRIST COMPLETE 3+V*L*
4 series · 4 of 4 positions shown · non-contrast
Comparison: Wrist series of January 21, 2014.

CLINICAL DATA: Follow-up of left distal radius fracture; date of
original injury January 12, 2014; persistent pain

EXAM:
LEFT WRIST - COMPLETE 3+ VIEW

[x wrist pa left]
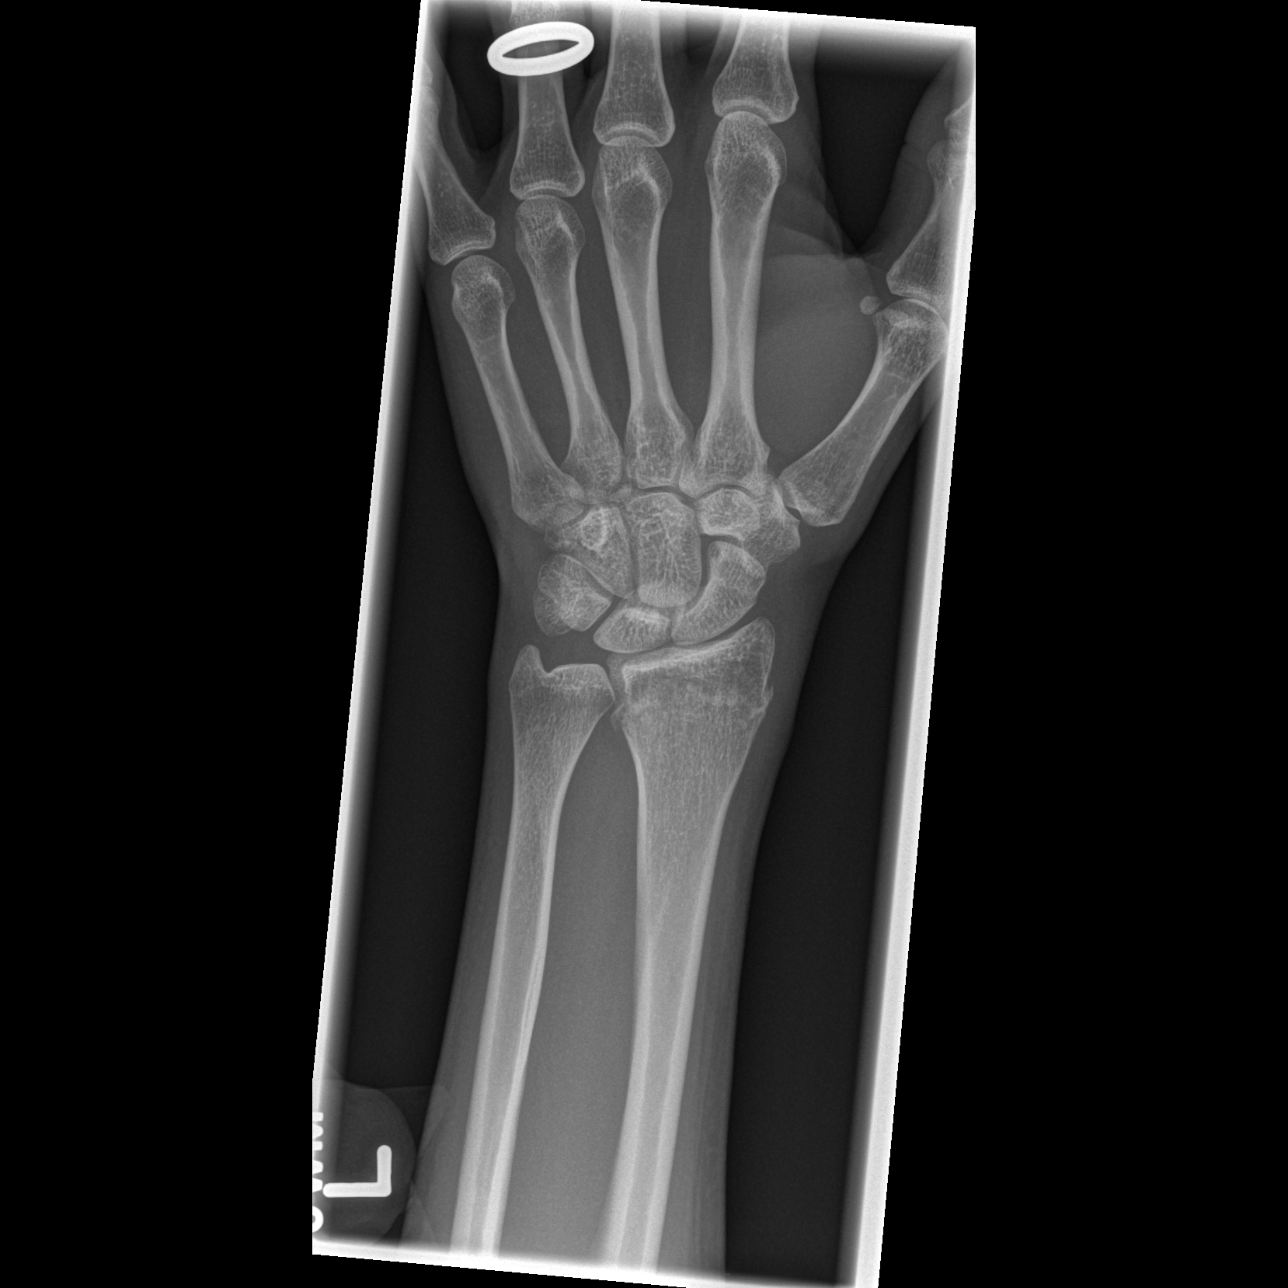

[x wrist obl left]
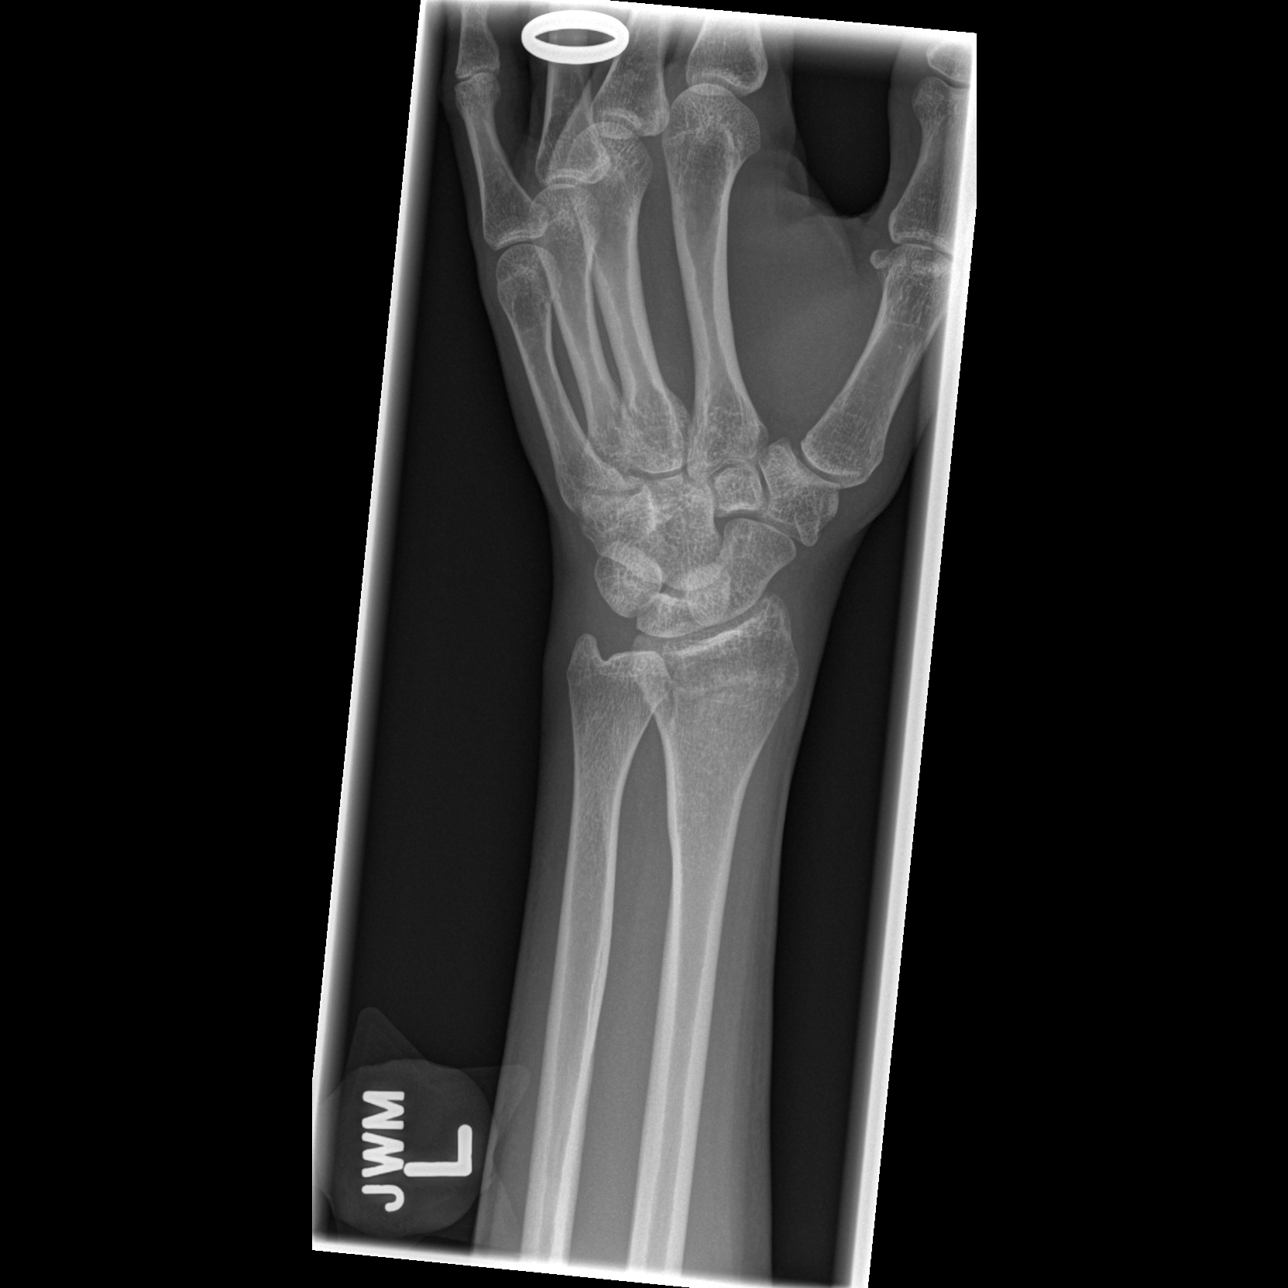

[x wrist lat left]
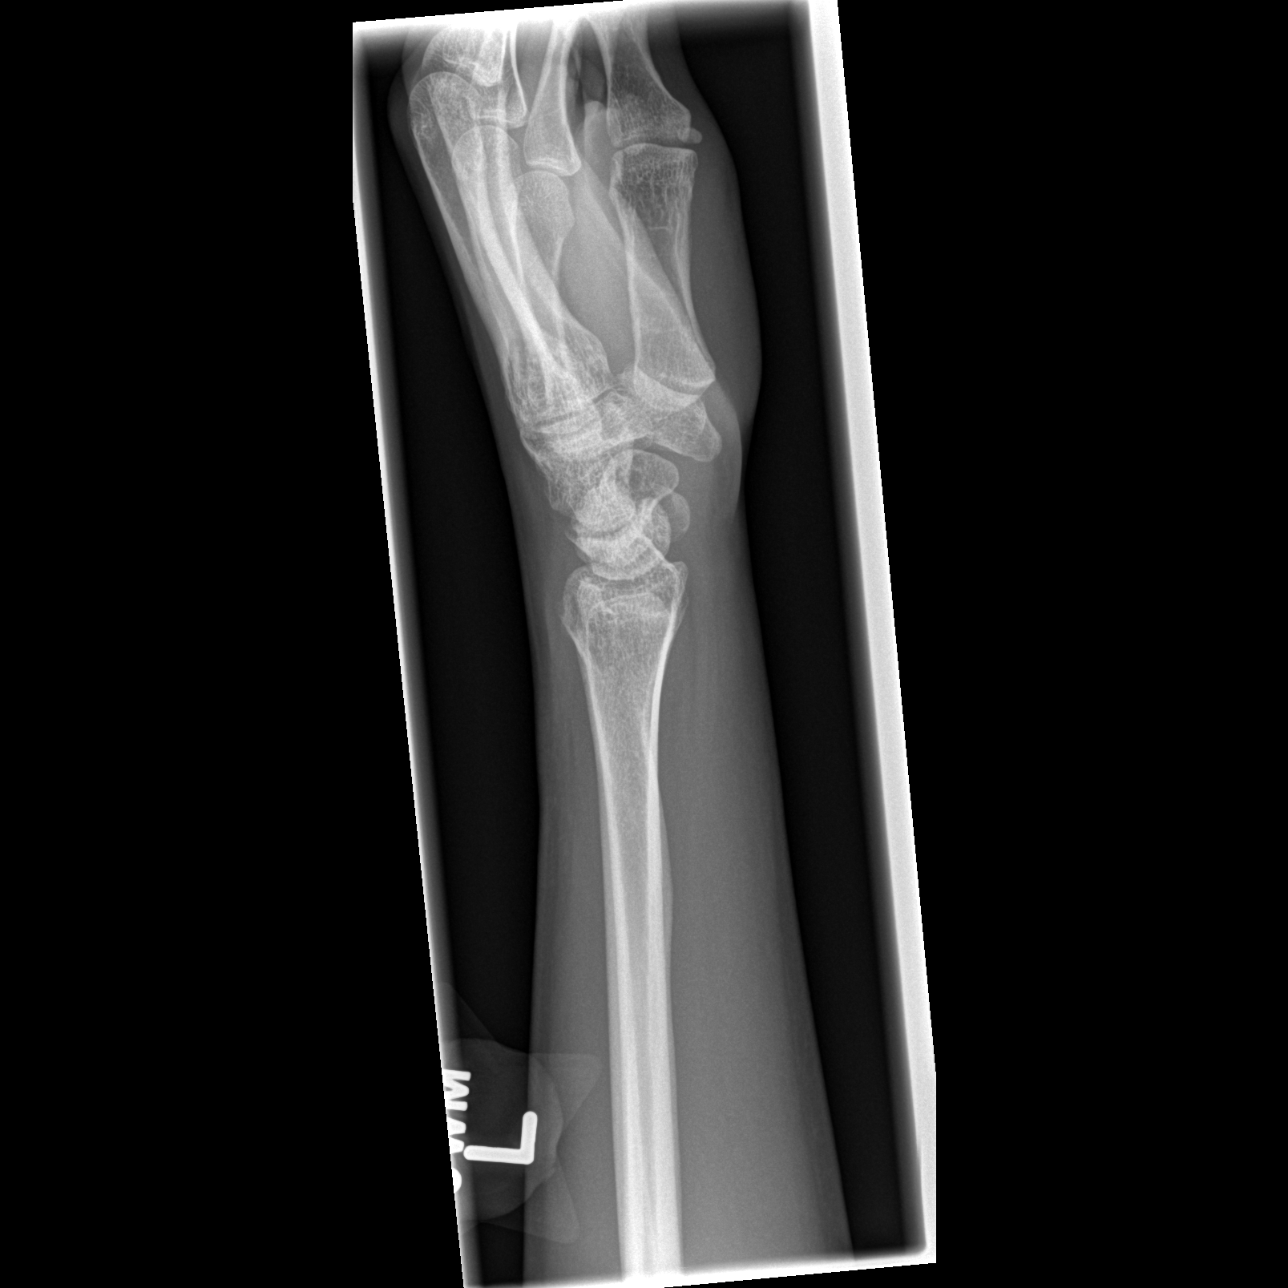

[x navicular]
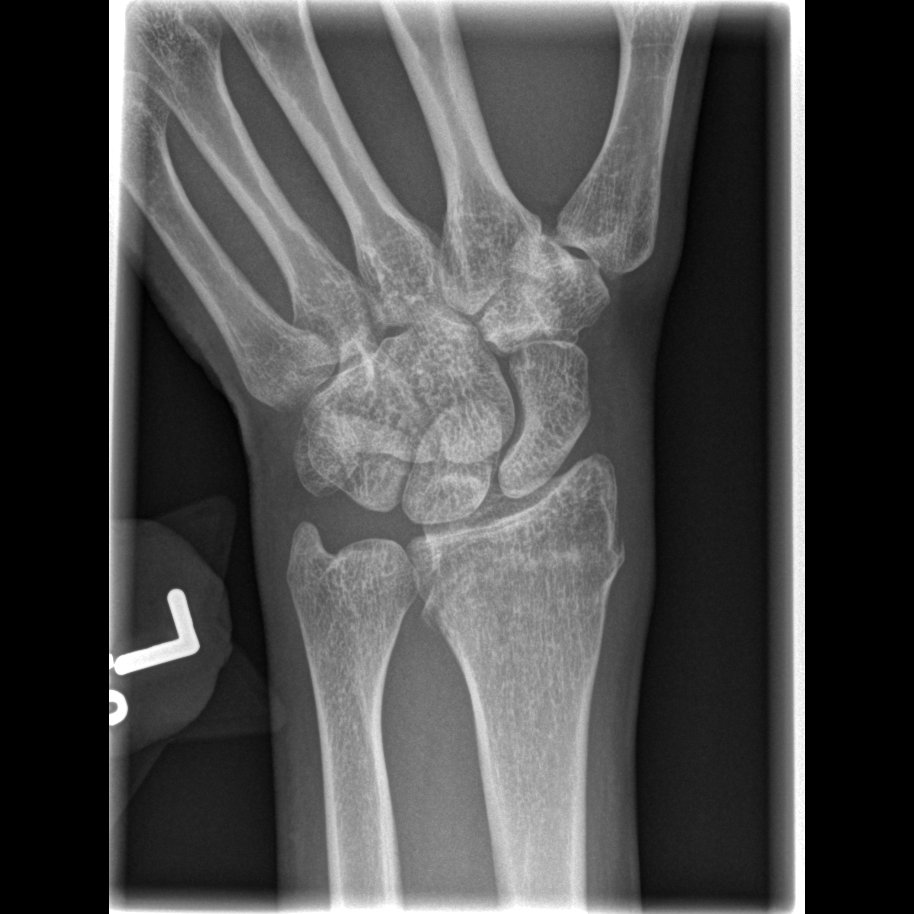

[4 of 4 positions shown; findings below may reference images not displayed]

FINDINGS: The impacted radial metaphyseal fracture is again demonstrated. The
fracture line is better defined than on the [REDACTED] study but
is similar to that seen on the [REDACTED] study. The adjacent
ulna is intact. The carpal bones are unremarkable.
IMPRESSION: There is ongoing bony resorption around the transversely oriented
impacted fracture of the distal left radial metaphysis. There is not
been dramatic interval change since the study [DATE].

## 2015-09-29 IMAGING — CR DG WRIST COMPLETE 3+V*L*
4 series · 4 of 4 positions shown · non-contrast
Comparison: 02/04/2014

CLINICAL DATA: Left radial fracture, subsequent encounter

EXAM:
LEFT WRIST - COMPLETE 3+ VIEW

[x wrist pa left]
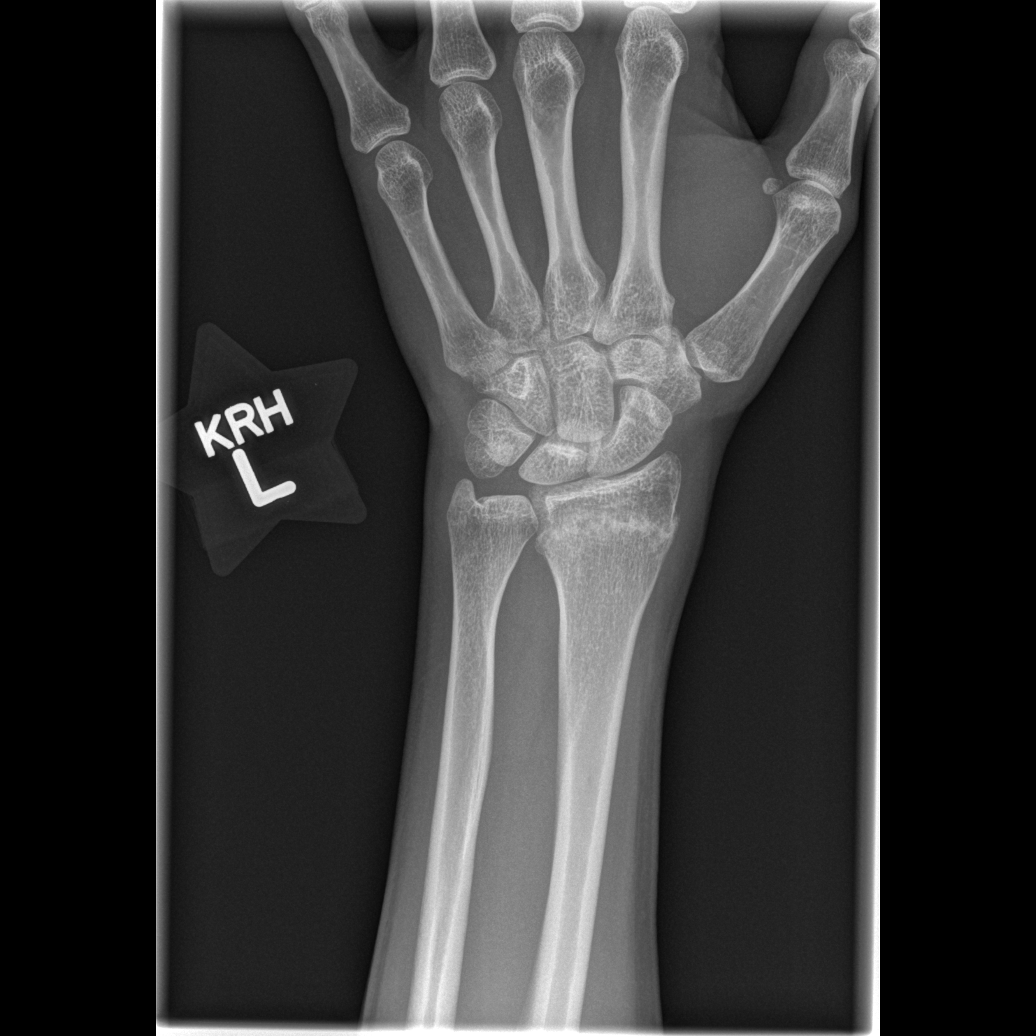

[x wrist obl left]
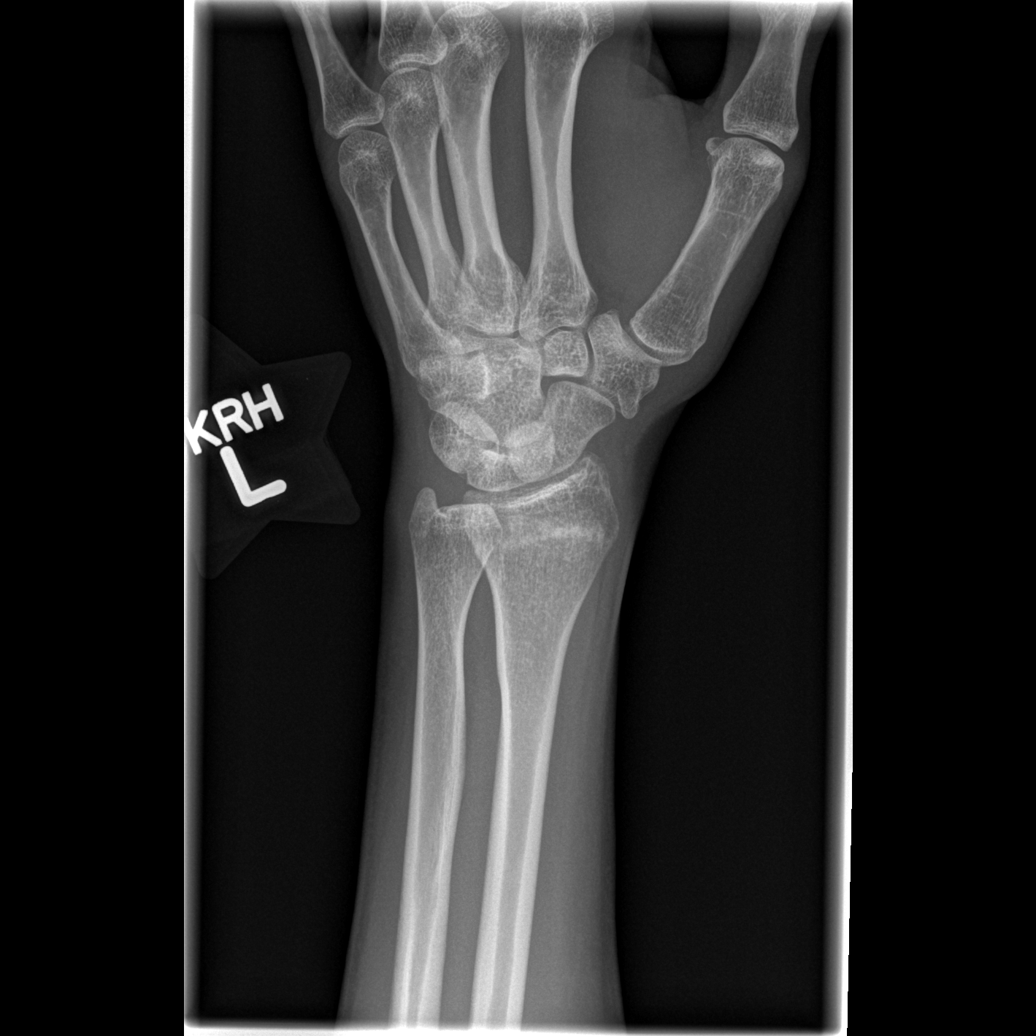

[x wrist lat left]
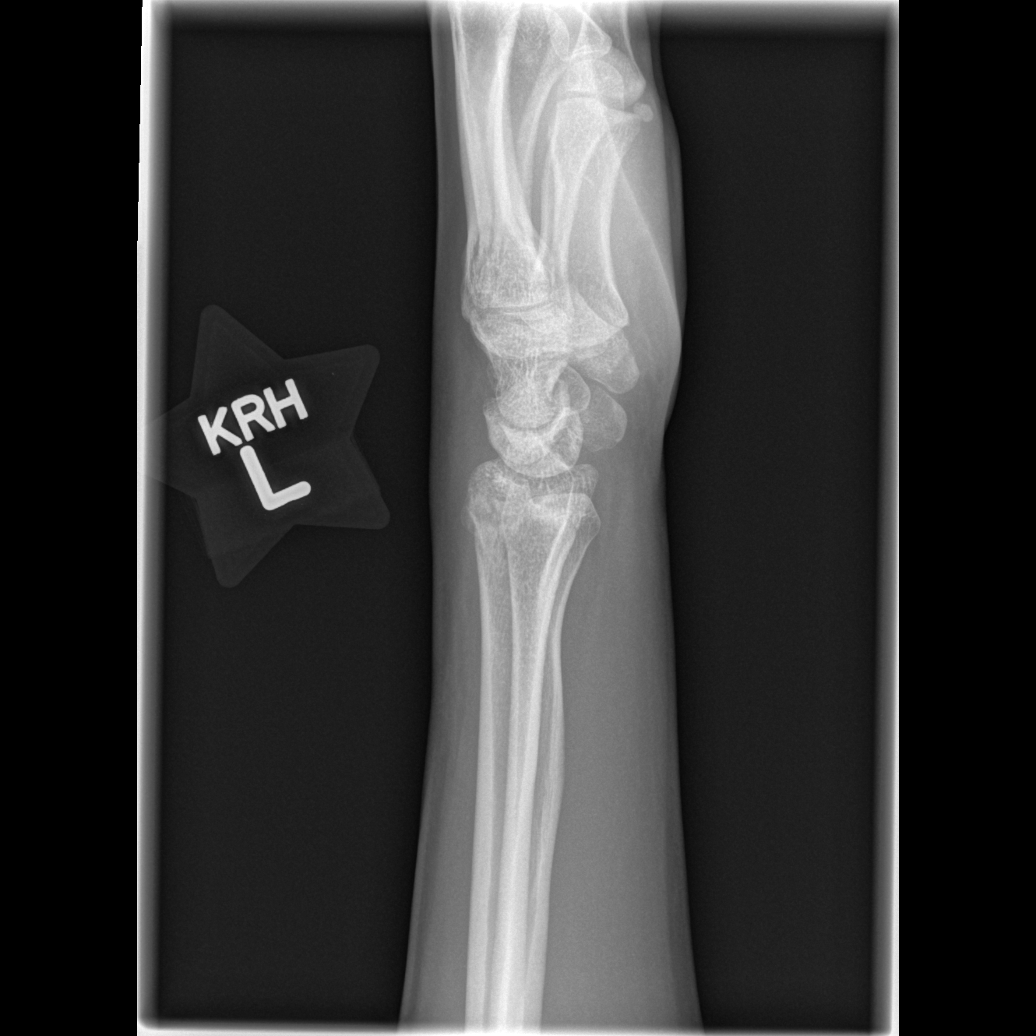

[x navicular]
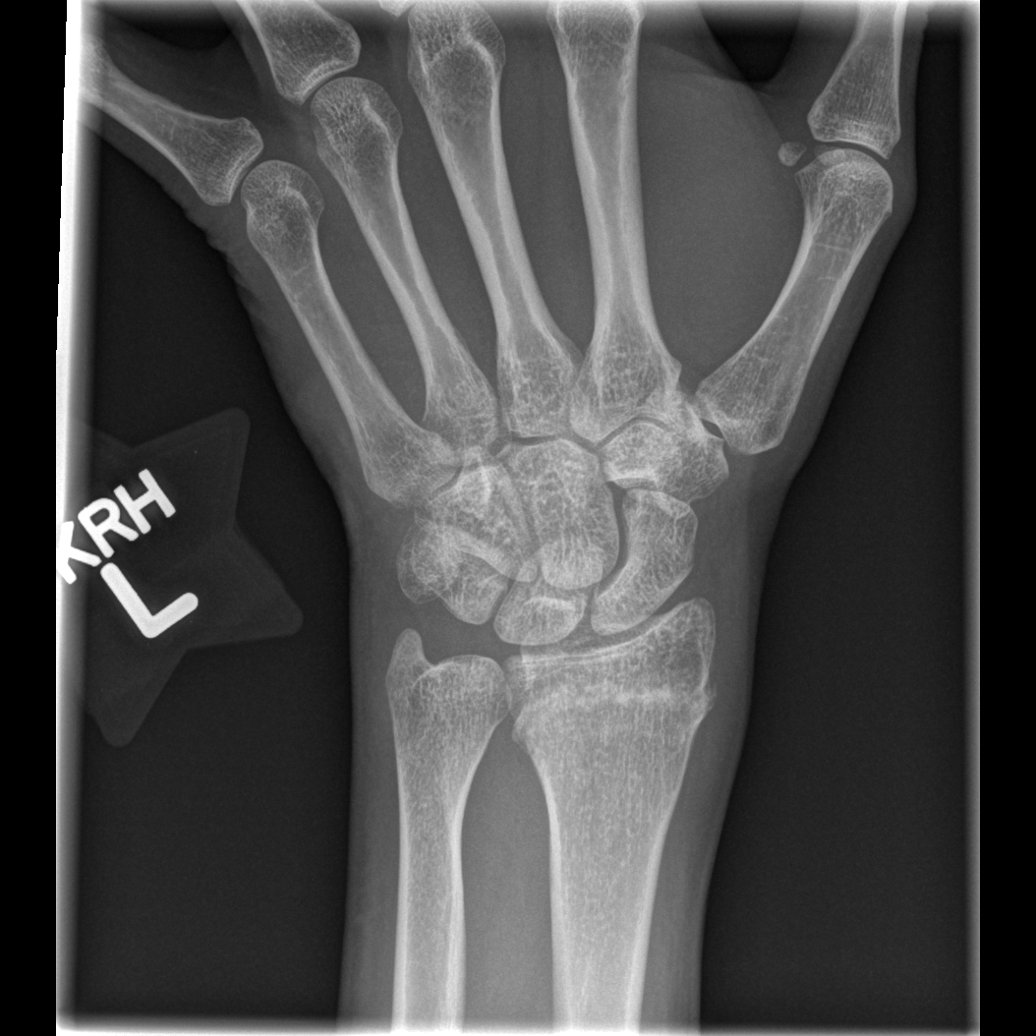

[4 of 4 positions shown; findings below may reference images not displayed]

FINDINGS: Persistent nondisplaced impacted left distal radial fracture without
significant callus formation. No other fracture or dislocation.
Normal soft tissues.
IMPRESSION: 1. Persistent, nondisplaced impacted left distal radial fracture
without significant callus formation.

## 2016-02-27 DIAGNOSIS — J019 Acute sinusitis, unspecified: Secondary | ICD-10-CM | POA: Diagnosis not present

## 2016-03-30 DIAGNOSIS — Z01419 Encounter for gynecological examination (general) (routine) without abnormal findings: Secondary | ICD-10-CM | POA: Diagnosis not present

## 2016-03-30 DIAGNOSIS — Z1212 Encounter for screening for malignant neoplasm of rectum: Secondary | ICD-10-CM | POA: Diagnosis not present

## 2016-04-05 DIAGNOSIS — R5383 Other fatigue: Secondary | ICD-10-CM | POA: Diagnosis not present

## 2016-04-05 DIAGNOSIS — Z0000 Encounter for general adult medical examination without abnormal findings: Secondary | ICD-10-CM | POA: Diagnosis not present

## 2016-04-05 DIAGNOSIS — Z131 Encounter for screening for diabetes mellitus: Secondary | ICD-10-CM | POA: Diagnosis not present

## 2016-04-05 DIAGNOSIS — Z13220 Encounter for screening for lipoid disorders: Secondary | ICD-10-CM | POA: Diagnosis not present

## 2016-07-12 DIAGNOSIS — E039 Hypothyroidism, unspecified: Secondary | ICD-10-CM | POA: Diagnosis not present

## 2016-07-12 DIAGNOSIS — R5382 Chronic fatigue, unspecified: Secondary | ICD-10-CM | POA: Diagnosis not present

## 2016-07-13 DIAGNOSIS — E039 Hypothyroidism, unspecified: Secondary | ICD-10-CM | POA: Diagnosis not present

## 2016-07-13 DIAGNOSIS — R5382 Chronic fatigue, unspecified: Secondary | ICD-10-CM | POA: Diagnosis not present

## 2016-10-11 DIAGNOSIS — Z23 Encounter for immunization: Secondary | ICD-10-CM | POA: Diagnosis not present

## 2016-10-20 ENCOUNTER — Encounter: Payer: Self-pay | Admitting: Family Medicine

## 2017-03-28 DIAGNOSIS — J0100 Acute maxillary sinusitis, unspecified: Secondary | ICD-10-CM | POA: Diagnosis not present

## 2017-04-10 DIAGNOSIS — H10413 Chronic giant papillary conjunctivitis, bilateral: Secondary | ICD-10-CM | POA: Diagnosis not present

## 2017-04-10 DIAGNOSIS — E559 Vitamin D deficiency, unspecified: Secondary | ICD-10-CM | POA: Diagnosis not present

## 2017-04-10 DIAGNOSIS — Z79899 Other long term (current) drug therapy: Secondary | ICD-10-CM | POA: Diagnosis not present

## 2017-04-10 DIAGNOSIS — R5382 Chronic fatigue, unspecified: Secondary | ICD-10-CM | POA: Diagnosis not present

## 2017-04-10 DIAGNOSIS — E039 Hypothyroidism, unspecified: Secondary | ICD-10-CM | POA: Diagnosis not present

## 2017-04-26 DIAGNOSIS — Z681 Body mass index (BMI) 19 or less, adult: Secondary | ICD-10-CM | POA: Diagnosis not present

## 2017-04-26 DIAGNOSIS — Z01419 Encounter for gynecological examination (general) (routine) without abnormal findings: Secondary | ICD-10-CM | POA: Diagnosis not present

## 2017-10-08 ENCOUNTER — Encounter (HOSPITAL_BASED_OUTPATIENT_CLINIC_OR_DEPARTMENT_OTHER): Payer: Self-pay

## 2017-10-08 ENCOUNTER — Emergency Department (HOSPITAL_BASED_OUTPATIENT_CLINIC_OR_DEPARTMENT_OTHER)
Admission: EM | Admit: 2017-10-08 | Discharge: 2017-10-08 | Disposition: A | Discharge status: Home / Self Care | Payer: 59 | Attending: Emergency Medicine | Admitting: Emergency Medicine

## 2017-10-08 ENCOUNTER — Other Ambulatory Visit: Payer: Self-pay

## 2017-10-08 DIAGNOSIS — Z79899 Other long term (current) drug therapy: Secondary | ICD-10-CM | POA: Insufficient documentation

## 2017-10-08 DIAGNOSIS — T63441A Toxic effect of venom of bees, accidental (unintentional), initial encounter: Secondary | ICD-10-CM | POA: Diagnosis not present

## 2017-10-08 MED ORDER — DIPHENHYDRAMINE HCL 50 MG/ML IJ SOLN
INTRAMUSCULAR | Status: AC
  Administered 2017-10-08: 50 mg
  Filled 2017-10-08: qty 1

## 2017-10-08 MED ORDER — METHYLPREDNISOLONE SODIUM SUCC 125 MG IJ SOLR
INTRAMUSCULAR | Status: AC
  Administered 2017-10-08: 125 mg
  Filled 2017-10-08: qty 2

## 2017-10-08 MED ORDER — EPINEPHRINE 0.3 MG/0.3ML IJ SOAJ: INTRAMUSCULAR | 0 refills | Status: AC

## 2017-10-08 MED ORDER — FAMOTIDINE IN NACL 20-0.9 MG/50ML-% IV SOLN
INTRAVENOUS | Status: AC
  Administered 2017-10-08: 20 mg
  Filled 2017-10-08: qty 50

## 2017-10-08 MED ORDER — PREDNISONE 20 MG PO TABS: 40.0000 mg | ORAL_TABLET | Freq: Every day | ORAL | 0 refills | Status: AC

## 2017-10-08 NOTE — ED Triage Notes (Signed)
Pt reports she was stung 3 times by ?hornet approx pta. Pt present with lips swollen, skin red, hands swollen, full body hives. Pt denies known allergy to bee stings. Provider at bedside to evaluate

## 2017-10-08 NOTE — ED Provider Notes (Signed)
[facility] EMERGENCY DEPARTMENT Provider Note   CSN: [id number] Arrival date & time: 10/08/17  1840     History   Chief Complaint Chief Complaint  Patient presents with  . Allergic Reaction    HPI Desiree Pham is a 47 y.o. female who presents today for evaluation of an allergic reaction.  She reports that she was stung by a bee or wasp 30 minutes prior to arrival.  She has not had any reactions similar before.  She was stung on her bilateral hands.  She reports that since then she has been having swelling of her upper lip and full-body redness is very itchy.  She denies any shortness of breath, nausea vomiting abdominal pain or diarrhea.  She does not feel like her throat is tight.  HPI  Past Medical History:  Diagnosis Date  . Thyroid disease     Patient Active Problem List   Diagnosis Date Noted  . Closed fracture of left distal radius 02/03/2014  . Lateral epicondylitis of right elbow 12/18/2013    History reviewed. No pertinent surgical history.   OB History   None      Home Medications    Prior to Admission medications   Medication Sig Start Date End Date Taking? Authorizing Provider  5-Hydroxytryptophan (5-HTP PO) Take 1 tablet by mouth 2 (two) times daily.    [provider]  ARMOUR THYROID 60 MG tablet  03/12/14   [provider]  CALCIUM PO Take 1 tablet by mouth daily.    [provider]  diclofenac sodium (VOLTAREN) 1 % GEL Apply 2 g topically 4 (four) times daily. 03/13/14   Andrena Mews, DO  EPINEPHrine 0.3 mg/0.3 mL IJ SOAJ injection Inject one device into the side of the thigh for severe allergic reactions.  May repeat with second device in 5-10 minutes if needed.  After using call 9-11. 10/08/17   Cristina Gong, PA-C  fish oil-omega-3 fatty acids 1000 MG capsule Take 2 g by mouth daily.    [provider]  levothyroxine (SYNTHROID, LEVOTHROID) 75 MCG tablet Take 75 mcg by mouth daily before  breakfast.    [provider]  liothyronine (CYTOMEL) 5 MCG tablet  03/05/14   [provider]  MILK THISTLE PO Take 1 tablet by mouth daily.    [provider]  minocycline (MINOCIN,DYNACIN) 100 MG capsule  09/26/13   [provider]  Multiple Vitamins-Minerals (MULTIVITAMIN WITH MINERALS) tablet Take 1 tablet by mouth daily.    [provider]  naproxen (NAPROSYN) 500 MG tablet Take 1 tablet (500 mg total) by mouth 2 (two) times daily. 01/13/14   Vanetta Mulders, MD  predniSONE (DELTASONE) 20 MG tablet Take 2 tablets (40 mg total) by mouth daily for 3 days. 10/08/17 10/11/17  Cristina Gong, PA-C  PRESCRIPTION MEDICATION Take 1 Troche by mouth daily. mm6+    [provider]  Probiotic Product (PROBIOTIC PO) Take 1 tablet by mouth daily.    [provider]  traMADol (ULTRAM) 50 MG tablet Take 1 tablet (50 mg total) by mouth every 6 (six) hours as needed. 01/13/14   Vanetta Mulders, MD  tretinoin (RETIN-A) 0.05 % cream  10/01/13   [provider]    Family History No family history on file.  Social History Social History   Tobacco Use  . Smoking status: Never Smoker  . Smokeless tobacco: Never Used  Substance Use Topics  . Alcohol use: Yes    Alcohol/week:  0.0 standard drinks  . Drug use: No     Allergies   Codeine   Review of Systems Review of Systems  HENT: Positive for facial swelling. Negative for ear pain and sore throat.   Eyes: Negative for pain and visual disturbance.  Respiratory: Negative for cough, chest tightness and shortness of breath.   Cardiovascular: Negative for chest pain and palpitations.  Gastrointestinal: Negative for abdominal pain, diarrhea, nausea and vomiting.  Genitourinary: Negative for dysuria and hematuria.  Musculoskeletal: Negative for arthralgias and back pain.  Skin: Positive for color change and rash.  Neurological: Negative for seizures, syncope and headaches.  All  other systems reviewed and are negative.    Physical Exam Updated Vital Signs BP 113/61   Pulse 66   Temp 98.1 F (36.7 C) (Oral)   Resp 12   Ht 5\' 6"  (1.676 m)   Wt 48.1 kg   LMP 08/14/2017   SpO2 100%   BMI 17.11 kg/m   Physical Exam  Constitutional: She is oriented to person, place, and time. She appears well-developed and well-nourished. No distress.  HENT:  Head: Normocephalic and atraumatic.  Mild swelling to upper lip, Left >R.  No swelling to lower lip.  No elevation to floor of mouth.  Normal phonation.  No visible posterior oropharyngeal edema.    Eyes: Conjunctivae are normal.  Neck: Normal range of motion. Neck supple.  Cardiovascular: Normal rate, regular rhythm and normal heart sounds.  No murmur heard. Pulmonary/Chest: Effort normal and breath sounds normal. No stridor. No respiratory distress. She has no wheezes.  Abdominal: Soft. Bowel sounds are normal. She exhibits no distension. There is no tenderness. There is no guarding.  Musculoskeletal: She exhibits no edema.  Neurological: She is alert and oriented to person, place, and time.  Skin: Skin is warm and dry. Capillary refill takes less than 2 seconds.  Patient has diffuse redness across head, neck, torso, and extremities bilaterally. Right hand is swollen.  No evidence of singers present in sting sites.    Psychiatric: She has a normal mood and affect. Her behavior is normal.  Nursing note and vitals reviewed.    ED Treatments / Results  Labs (all labs ordered are listed, but only abnormal results are displayed) Labs Reviewed - No data to display  EKG None  Radiology No results found.  Procedures Procedures (including critical care time)  Medications Ordered in ED Medications  methylPREDNISolone sodium succinate (SOLU-MEDROL) 125 mg/2 mL injection (125 mg  Given 10/08/17 1852)  diphenhydrAMINE (BENADRYL) 50 MG/ML injection (50 mg  Given 10/08/17 1852)  famotidine (PEPCID) 20-0.9 MG/50ML-% IVPB  (  Stopped 10/08/17 1941)     Initial Impression / Assessment and Plan / ED Course  I have reviewed the triage vital signs and the nursing notes.  Pertinent labs & imaging results that were available during my care of the patient were reviewed by me and considered in my medical decision making (see chart for details).  Clinical Course as of Oct 09 100  Wynelle Link Oct 08, 2017  1610 Patient reevaluated, says that she is feeling better and much less itchy.  We will continue to monitor   [EH]  2058 Patient re-evaluated, her lip swelling is better, however she has been itching on her hands for about 20 minutes.  She was stung on her bilateral hands.  Will monitor.     [EH]    Clinical Course User Index [EH] Cristina Gong, PA-C   Presents today for evaluation  of allergic reaction after multiple bee stings to her bilateral hands.  She had a diffuse generalized redness and itching of her skin, along with swelling to her upper lip.  She had normal phonation without feelings of her throat was closing, no apparent GI or respiratory involvement.  She was treated with IV Benadryl, steroids, and Pepcid, after which she had significant improvement.  She was observed in the department for multiple hours without significant worsening or recurrent reaction.  Discussed course of allergic reaction with patient.  She is given prescriptions for steroids and EpiPen's for at home.  Discussed proper use of EpiPen.  Instructed patient that benadryl can make people drowsy and sleepy and she should not drive or operate heavy machinery after getting this today.  Return precautions were discussed with patient who states their understanding.  Reccommended obtaining epi-pens tonight.  At the time of discharge patient denied any unaddressed complaints or concerns.  Patient is agreeable for discharge home.   Final Clinical Impressions(s) / ED Diagnoses   Final diagnoses:  Bee sting reaction, accidental or unintentional,  initial encounter    ED Discharge Orders         Ordered    EPINEPHrine 0.3 mg/0.3 mL IJ SOAJ injection     10/08/17 2211    predniSONE (DELTASONE) 20 MG tablet  Daily     10/08/17 2211           Cristina Gong, PA-C 10/09/17 0104    Charlynne Pander, MD 10/11/17 [phone]

## 2017-10-08 NOTE — ED Notes (Signed)
ED Provider at bedside. 

## 2017-10-08 NOTE — ED Notes (Signed)
Pt reports decreased itching. Redness has decreased and pt appears to be in NAD

## 2017-10-08 NOTE — Discharge Instructions (Addendum)
Today you were seen and evaluated for an allergic reaction.  You were given multiple medications in the emergency room including IV benadryl (which may make you sleepy) and IV steroids.    If you develop any concerning symptoms, especially feeling like your throat is closing, shortness of breath, swelling of the face, lips or tongue then please return to the emergency room immediately for evaluation.    For the next 24 hours please take 1 pill (25 mg) of benadryl every 6 hours.  If you have mild symptoms such as itching, mild nausea you may take a second benadryl.  After 24 hours please start taking zyrtec.  While the box tells you to take one pill every 24 hours I want you to take one pill every 12 hours for the next few days.  This will make you sleepy and you should not drive, operate heavy machinery or perform any task where if you were to fall asleep or make a slow decision it could cause harm to you or someone else.   You have also been given a prescription for 3 days of steroids.  Please take your first dose about 24 hours after you were given steroids in the emergency room.  Steroids may give you the feeling of being hot, increased appetite, and extra energy.    I have given you a prescription for two epi pens.  Please make sure you keep one with you at all times.  Please consider keeping two pills of benadryl with your epi pen and if you use it take the benadryl and call 9-11.  Please do not store epi-pens in your car or allow them to get very hot or cold as this can make them not work as well.

## 2017-10-18 DIAGNOSIS — E559 Vitamin D deficiency, unspecified: Secondary | ICD-10-CM | POA: Diagnosis not present

## 2017-10-18 DIAGNOSIS — E039 Hypothyroidism, unspecified: Secondary | ICD-10-CM | POA: Diagnosis not present

## 2017-10-18 DIAGNOSIS — Z0000 Encounter for general adult medical examination without abnormal findings: Secondary | ICD-10-CM | POA: Diagnosis not present

## 2017-10-18 DIAGNOSIS — R5382 Chronic fatigue, unspecified: Secondary | ICD-10-CM | POA: Diagnosis not present

## 2017-11-17 DIAGNOSIS — Z23 Encounter for immunization: Secondary | ICD-10-CM | POA: Diagnosis not present

## 2018-04-03 DIAGNOSIS — Z803 Family history of malignant neoplasm of breast: Secondary | ICD-10-CM | POA: Diagnosis not present

## 2018-04-03 DIAGNOSIS — Z800 Family history of malignant neoplasm of digestive organs: Secondary | ICD-10-CM | POA: Diagnosis not present

## 2018-04-03 DIAGNOSIS — N761 Subacute and chronic vaginitis: Secondary | ICD-10-CM | POA: Diagnosis not present

## 2018-04-03 DIAGNOSIS — Z8042 Family history of malignant neoplasm of prostate: Secondary | ICD-10-CM | POA: Diagnosis not present

## 2018-04-03 DIAGNOSIS — B373 Candidiasis of vulva and vagina: Secondary | ICD-10-CM | POA: Diagnosis not present

## 2018-04-12 DIAGNOSIS — H10413 Chronic giant papillary conjunctivitis, bilateral: Secondary | ICD-10-CM | POA: Diagnosis not present

## 2018-05-30 DIAGNOSIS — Z01419 Encounter for gynecological examination (general) (routine) without abnormal findings: Secondary | ICD-10-CM | POA: Diagnosis not present

## 2018-05-30 DIAGNOSIS — Z681 Body mass index (BMI) 19 or less, adult: Secondary | ICD-10-CM | POA: Diagnosis not present

## 2019-04-07 ENCOUNTER — Ambulatory Visit: Payer: 59 | Attending: Internal Medicine

## 2019-04-07 DIAGNOSIS — Z23 Encounter for immunization: Secondary | ICD-10-CM | POA: Insufficient documentation

## 2019-04-07 NOTE — Progress Notes (Signed)
   Covid-19 Vaccination Clinic  Name:  Desiree Pham    MRN: [id number] DOB: April 13, 1970  04/07/2019  [name] was observed post Covid-19 immunization for 15 minutes without incident. She was provided with Vaccine Information Sheet and instruction to access the V-Safe system.   [name] was instructed to call 911 with any severe reactions post vaccine: Marland Kitchen Difficulty breathing  . Swelling of face and throat  . A fast heartbeat  . A bad rash all over body  . Dizziness and weakness   Immunizations Administered    Name Date Dose VIS Date Route   Pfizer COVID-19 Vaccine 04/07/2019  3:10 PM 0.3 mL 01/11/2019 Intramuscular   Manufacturer: [employer], [employer]   Lot: [id number]   NDC: [address]-1

## 2019-05-08 ENCOUNTER — Ambulatory Visit: Payer: 59 | Attending: Internal Medicine

## 2019-05-08 DIAGNOSIS — Z23 Encounter for immunization: Secondary | ICD-10-CM

## 2019-05-08 NOTE — Progress Notes (Signed)
   Covid-19 Vaccination Clinic  Name:  Desiree Pham    MRN: [id number] DOB: 1970/03/21  05/08/2019  [name] was observed post Covid-19 immunization for 15 minutes without incident. She was provided with Vaccine Information Sheet and instruction to access the V-Safe system.   [name] was instructed to call 911 with any severe reactions post vaccine: Marland Kitchen Difficulty breathing  . Swelling of face and throat  . A fast heartbeat  . A bad rash all over body  . Dizziness and weakness   Immunizations Administered    Name Date Dose VIS Date Route   Pfizer COVID-19 Vaccine 05/08/2019 12:46 PM 0.3 mL 01/11/2019 Intramuscular   Manufacturer: [employer], [employer]   Lot: [id number]   NDC: [address]-2

## 2020-01-21 ENCOUNTER — Other Ambulatory Visit: Payer: Self-pay | Admitting: Sports Medicine

## 2020-01-21 DIAGNOSIS — M25531 Pain in right wrist: Secondary | ICD-10-CM

## 2020-02-25 ENCOUNTER — Ambulatory Visit
Admission: RE | Admit: 2020-02-25 | Discharge: 2020-02-25 | Disposition: A | Discharge status: Home / Self Care | Payer: 59 | Source: Ambulatory Visit | Attending: Sports Medicine | Admitting: Sports Medicine

## 2020-02-25 ENCOUNTER — Other Ambulatory Visit: Payer: Self-pay

## 2020-02-25 DIAGNOSIS — M25531 Pain in right wrist: Secondary | ICD-10-CM

## 2020-02-25 MED ORDER — IOPAMIDOL (ISOVUE-M 200) INJECTION 41%
3.0000 mL | Freq: Once | INTRAMUSCULAR | Status: AC
  Administered 2020-02-25: 3 mL via INTRA_ARTICULAR

## 2020-02-26 ENCOUNTER — Other Ambulatory Visit: Payer: Self-pay | Admitting: Sports Medicine

## 2020-02-26 DIAGNOSIS — M25531 Pain in right wrist: Secondary | ICD-10-CM

## 2020-03-05 ENCOUNTER — Other Ambulatory Visit: Payer: Self-pay

## 2020-03-05 ENCOUNTER — Ambulatory Visit
Admission: RE | Admit: 2020-03-05 | Discharge: 2020-03-05 | Disposition: A | Discharge status: Home / Self Care | Payer: 59 | Source: Ambulatory Visit | Attending: Sports Medicine | Admitting: Sports Medicine

## 2020-03-05 DIAGNOSIS — M25531 Pain in right wrist: Secondary | ICD-10-CM

## 2020-03-05 MED ORDER — IOPAMIDOL (ISOVUE-M 200) INJECTION 41%
3.0000 mL | Freq: Once | INTRAMUSCULAR | Status: AC
  Administered 2020-03-05: 3 mL via INTRA_ARTICULAR

## 2022-03-15 ENCOUNTER — Ambulatory Visit: Payer: Self-pay | Admitting: Allergy

## 2023-01-03 ENCOUNTER — Telehealth: Payer: Self-pay

## 2023-01-03 NOTE — Telephone Encounter (Signed)
Pt sent a my chart to cancel apt that was made today. I called to confirm and had to leave a vmail 01-03-23

## 2023-01-23 ENCOUNTER — Ambulatory Visit: Payer: 59

## 2023-05-10 ENCOUNTER — Ambulatory Visit (INDEPENDENT_AMBULATORY_CARE_PROVIDER_SITE_OTHER): Payer: 59 | Admitting: Professional Counselor

## 2023-05-10 ENCOUNTER — Encounter: Payer: Self-pay | Admitting: Professional Counselor

## 2023-05-10 DIAGNOSIS — F411 Generalized anxiety disorder: Secondary | ICD-10-CM | POA: Diagnosis not present

## 2023-05-10 DIAGNOSIS — F330 Major depressive disorder, recurrent, mild: Secondary | ICD-10-CM

## 2023-05-10 NOTE — Progress Notes (Signed)
 [facility] Counselor Initial Adult Exam  Name: Desiree Pham Date: 05/10/2023 MRN: [id number] DOB: March 13, 1970 PCP: Lorenza Romans, FNP  Time spent: 3:10 PM to 4:06 PM  Guardian/Payee:  pt    Paperwork requested:  No   Reason for Visit /Presenting Problem: Anxiety, depression  Mental Status Exam:    Appearance:   Neat     Behavior:  Appropriate and Sharing  Motor:  Normal  Speech/Language:   Clear and Coherent and Normal Rate  Affect:  Appropriate and Congruent  Mood:  normal  Thought process:  normal  Thought content:    WNL  Sensory/Perceptual disturbances:    WNL  Orientation:  oriented to person, place, time/date, and situation  Attention:  Good  Concentration:  Good  Memory:  WNL  Fund of knowledge:   Good  Insight:    Good  Judgment:   Good  Impulse Control:  Good   Reported Symptoms: Anhedonia, low mood, sleep concerns, fatigue, appetite concerns, low self-esteem, trouble concentrating, nervousness, worries, irritability, mind racing, stress, caregiver strain, interpersonal concerns, phase of life concerns  Risk Assessment: Danger to Self:  No Self-injurious Behavior: No Danger to Others: No Duty to Warn:no Physical Aggression / Violence:No  Access to Firearms a concern: No  Gang Involvement:No  Patient / guardian was educated about steps to take if suicide or homicide risk level increases between visits: n/a While future psychiatric events cannot be accurately predicted, the patient does not currently require acute inpatient psychiatric care and does not currently meet [facility]  involuntary commitment criteria.  Substance Abuse History: Current substance abuse: No  ;   Past Psychiatric History:   Previous psychological history is significant for anxiety and depression Outpatient Providers: yes, several counselors in adulthood; couples counseling History of Psych Hospitalization: No  Psychological Testing: no   Abuse History: Victim of No., n/a    Report needed: No. Victim of Neglect:No. Perpetrator of n/a  Witness / Exposure to Domestic Violence: No   Protective Services Involvement: No  Witness to [employer] Violence:  No   Family History: No family history on file. 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, and Leadership Skills, Positivity, Empathetic  Barriers:  n/a   Legal History: Pending legal issue / charges: The patient has no significant history of legal issues. History of legal issue / charges: n/a  Medical History/Surgical History:reviewed Past Medical History:  Diagnosis Date   Thyroid  disease   Two miscarriages by hx Fertility treatment by hx  No past surgical history on file.  Medications: Current Outpatient Medications  Medication Sig Dispense Refill   5-Hydroxytryptophan (5-HTP PO) Take 1 tablet by mouth 2 (two) times daily.     ARMOUR THYROID  60 MG tablet      CALCIUM PO Take 1 tablet by mouth  daily.     diclofenac  sodium (VOLTAREN ) 1 % GEL Apply 2 g topically 4 (four) times daily. 100 g 1   EPINEPHrine  0.3 mg/0.3 mL IJ SOAJ injection Inject one device into the side of the thigh for severe  allergic reactions.  May repeat with second device in 5-10 minutes if needed.  After using call 9-11. 2 Device 0   fish oil-omega-3 fatty acids 1000 MG capsule Take 2 g by mouth daily.     levothyroxine (SYNTHROID, LEVOTHROID) 75 MCG tablet Take 75 mcg by mouth daily before breakfast.     liothyronine (CYTOMEL) 5 MCG tablet      MILK THISTLE PO Take 1 tablet by mouth daily.     minocycline (MINOCIN,DYNACIN) 100 MG capsule   0   Multiple Vitamins-Minerals (MULTIVITAMIN WITH MINERALS) tablet Take 1 tablet by mouth daily.     naproxen  (NAPROSYN ) 500 MG tablet Take 1 tablet (500 mg total) by mouth 2 (two) times daily. 14 tablet 1   PRESCRIPTION MEDICATION Take 1 Troche by mouth daily. mm6+     Probiotic Product (PROBIOTIC PO) Take 1 tablet by mouth daily.     traMADol  (ULTRAM ) 50 MG tablet Take 1 tablet (50 mg total) by mouth every 6 (six) hours as needed. 15 tablet 0   tretinoin (RETIN-A) 0.05 % cream   0   No current facility-administered medications for this visit.    Allergies  Allergen Reactions   Codeine Nausea And Vomiting    Diagnoses:    ICD-10-CM   1. Generalized anxiety disorder  F41.1     2. Major depressive disorder, recurrent episode, mild (HCC)  F33.0       Treatment Provided: Counselor provided person-centered counseling including active listening, building of rapport; facilitation of PHQ 9 with a score of 8, GAD-7 with a score of 8, and MDQ with a score of 5 out of 13.  Patient presented to session to address concerns of anxiety and depression.  She endorsed MDQ symptomology as related to anxiety and times of heightened stress or increased fatigue, hormonal shifts, winter months, and past self-medicating with alcohol which she has since improved; patient does not endorse symptoms of mania.  Patient identified having new job which is physically demanding and in which she wears a lot of hats and with a learning curve, and processed experience of children in the latter part of  high school years and she and husband approaching empty nest, marital concerns, and caregiving role as relates her parents.  Patient identified her counseling goals as wishing to manage stress with less sense of overwhelm, develop coping skills to mitigate symptomology, worry less, and address matters of marital and intrapersonal development.  Plan of Care: Patient is scheduled for follow-up; continue to build rapport, assess symptoms and history, discuss treatment plan and obtain consent.  Anthon Kins, [facility]

## 2023-06-30 ENCOUNTER — Ambulatory Visit: Admitting: Professional Counselor

## 2023-07-11 ENCOUNTER — Encounter: Payer: Self-pay | Admitting: Professional Counselor

## 2023-07-11 ENCOUNTER — Ambulatory Visit (INDEPENDENT_AMBULATORY_CARE_PROVIDER_SITE_OTHER): Admitting: Professional Counselor

## 2023-07-11 DIAGNOSIS — F330 Major depressive disorder, recurrent, mild: Secondary | ICD-10-CM

## 2023-07-11 DIAGNOSIS — F411 Generalized anxiety disorder: Secondary | ICD-10-CM | POA: Diagnosis not present

## 2023-07-11 NOTE — Progress Notes (Signed)
      [facility] Counselor/Therapist Progress Note  Patient ID: Desiree Pham, MRN: [id number],    Date: 07/11/2023  Time Spent: 10:09 AM - 11:12 AM   Treatment Type: Individual Therapy  Reported Symptoms: restlessness, worries, stress, some emotional numbing, fatigue, interpersonal concerns   Mental Status Exam:  Appearance:   Neat     Behavior:  Appropriate, Sharing, and Motivated  Motor:  Normal  Speech/Language:   Clear and Coherent and Normal Rate  Affect:  Appropriate and Congruent  Mood:  normal  Thought process:  normal  Thought content:    WNL  Sensory/Perceptual disturbances:    WNL  Orientation:  oriented to person, place, time/date, and situation  Attention:  Good  Concentration:  Good  Memory:  WNL  Fund of knowledge:   Good  Insight:    Good  Judgment:   Good  Impulse Control:  Good   Risk Assessment: Danger to Self:  No Self-injurious Behavior: No Danger to Others: No Duty to Warn:no Physical Aggression / Violence:No  Access to Firearms a concern: No  Gang Involvement:No   Subjective: Patient presented to session to address concerns of anxiety depression.  She reported mixed progress at this time.  She identified meds to be helpful, increasing clarity and focus and helping with fatigue.  She reported work stress to have increased, however she also feel fulfilled by learning and opportunity to focus on her passions and problem-solving.  She processed experience of developments within her family, and identified necessity to let go of a lot because of so much going on in her life.  She processed experience of marital concerns, and counselor assisted patient with strategies in approaching challenging conversations.  Counselor affirmed patient feelings and experience and helped facilitate insight around patient multiple stress points in circumstances and phase of life.  Patient and counselor discussed patient treatment plan and patient gave her  consent.  Interventions: Solution-Oriented/Positive Psychology, Humanistic/Existential, Insight-Oriented, and Treatment Planning  Diagnosis:   ICD-10-CM   1. Generalized anxiety disorder  F41.1     2. Major depressive disorder, recurrent episode, mild (HCC)  F33.0       Plan: Pt is scheduled for a follow-up; continue process work and developing coping skills. Pt STG between sessions to continue to take medications as prescribed, and to resource self advocacy where interpersonal matters and work stress are concerned.  Almarie ONEIDA Sprang, [facility]

## 2023-07-19 ENCOUNTER — Ambulatory Visit: Admitting: Professional Counselor

## 2023-08-23 ENCOUNTER — Encounter: Payer: Self-pay | Admitting: Professional Counselor

## 2023-08-23 ENCOUNTER — Ambulatory Visit (INDEPENDENT_AMBULATORY_CARE_PROVIDER_SITE_OTHER): Admitting: Professional Counselor

## 2023-08-23 DIAGNOSIS — F411 Generalized anxiety disorder: Secondary | ICD-10-CM | POA: Diagnosis not present

## 2023-08-23 DIAGNOSIS — F330 Major depressive disorder, recurrent, mild: Secondary | ICD-10-CM | POA: Diagnosis not present

## 2023-08-23 NOTE — Progress Notes (Signed)
      [facility] Counselor/Therapist Progress Note  Patient ID: Desiree Pham, MRN: [id number],    Date: 08/23/2023  Time Spent: 11:09 AM - 12:08 PM  Treatment Type: Individual Therapy  Reported Symptoms: Stress, fatigue, anxiousness, worries, self-esteem concerns, intermittent low mood, interpersonal concerns, career concerns, phase of life concerns  Mental Status Exam:  Appearance:   Casual     Behavior:  Appropriate, Sharing, and Motivated  Motor:  Normal  Speech/Language:   Clear and Coherent and Normal Rate  Affect:  Appropriate and Congruent  Mood:  normal  Thought process:  normal  Thought content:    WNL  Sensory/Perceptual disturbances:    WNL  Orientation:  oriented to person, place, time/date, and situation  Attention:  Good  Concentration:  Good  Memory:  WNL  Fund of knowledge:   Good  Insight:    Good  Judgment:   Good  Impulse Control:  Good   Risk Assessment: Danger to Self:  No Self-injurious Behavior: No Danger to Others: No Duty to Warn:no Physical Aggression / Violence:No  Access to Firearms a concern: No  Gang Involvement:No   Subjective: Patient presented to session to address concerns of anxiety and depression.  She reported minimal progress at this time.  She identified continued uptake of stress in her life and difficulty balancing work with personal responsibilities related to family.  Patient identified concerns regarding self advocacy across his fears of life and having a sense of potential dip in depressive mood as feeling impending.  She voiced to have begun hormones for almost 2 weeks now, and to be continuing to take Prozac; she reflected on efficacy of Prozac and helpfulness of hormone stabilization.  Counselor encouraged patient to discuss side effects and symptomology with both providers prescribing medications.  Counselor encouraged patient to address self advocacy needs including discussion of email voicing her gratitude for opportunity  and role, strengths and value in occupying role, thoughts and considerations for improvement within role, with limitations of any apologetic or justifying content.  Counselor and patient also discussed possibility of referral for daughter for counseling given home life dynamic stressors.  Counselor and patient discussed themes of self and other empowerment.  Interventions: Assertiveness/Communication, Solution-Oriented/Positive Psychology, Humanistic/Existential, Insight-Oriented, and Resourcing, Referrals  Diagnosis:   ICD-10-CM   1. Generalized anxiety disorder  F41.1     2. Major depressive disorder, recurrent episode, mild (HCC)  F33.0       Plan: Patient is scheduled for follow-up; continue process work and developing coping skills.  STG between sessions for patient to craft work letter to address thoughts and considerations to enhance work/life balance per strategies discussed in session; consider referrals for daughter for counseling; continue to practice protecting time for self and family.  Desiree Pham, [facility]

## 2023-09-27 ENCOUNTER — Ambulatory Visit: Admitting: Professional Counselor

## 2023-10-16 ENCOUNTER — Encounter: Payer: Self-pay | Admitting: Professional Counselor

## 2023-10-16 ENCOUNTER — Ambulatory Visit: Admitting: Professional Counselor

## 2023-10-16 DIAGNOSIS — F330 Major depressive disorder, recurrent, mild: Secondary | ICD-10-CM | POA: Diagnosis not present

## 2023-10-16 DIAGNOSIS — F411 Generalized anxiety disorder: Secondary | ICD-10-CM | POA: Diagnosis not present

## 2023-10-16 NOTE — Progress Notes (Signed)
      [facility] Counselor/Therapist Progress Note  Patient ID: Desiree Pham, MRN: [id number],    Date: 10/16/2023  Time Spent: 3:04 PM to 4:03 PM  Treatment Type: Individual Therapy  Reported Symptoms: Intermittent low mood, stress, worries, restlessness, anxiousness, phase of life concerns, career concerns, interpersonal concerns  Mental Status Exam:  Appearance:   Casual     Behavior:  Appropriate, Sharing, and Motivated  Motor:  Normal  Speech/Language:   Clear and Coherent and Normal Rate  Affect:  Appropriate and Congruent  Mood:  normal  Thought process:  normal  Thought content:    WNL  Sensory/Perceptual disturbances:    WNL  Orientation:  oriented to person, place, time/date, and situation  Attention:  Good  Concentration:  Good  Memory:  WNL  Fund of knowledge:   Good  Insight:    Good  Judgment:   Good  Impulse Control:  Good   Risk Assessment: Danger to Self:  No Self-injurious Behavior: No Danger to Others: No Duty to Warn:no Physical Aggression / Violence:No  Access to Firearms a concern: No  Gang Involvement:No   Subjective: Patient presented to session to address concerns of anxiety and depression.  She reported progress at this time.  She reported to be taking better care of herself, including listening to herself and her needs, and self advocating and personal and professional life.  She processed the experience of acting from integrity, and discerned regarding career considerations.  She processed experience of family life, including as relates her parents, and her fathers health concerns.  She identified staying positive and keeping focus on assorted responsibilities including parenting.  She identified short-term goal of coping to continue with career discernment, and decluttering.  Counselor actively listened, affirmed patient feelings and experience, reinforced patient positivity, self-care, and resourcing, and helped patient with strategies regarding  decluttering tasks including as relates identifying an accountability partner.  Interventions: Assertiveness/Communication, Solution-Oriented/Positive Psychology, Humanistic/Existential, Insight-Oriented, and Career Counseling  Diagnosis:   ICD-10-CM   1. Generalized anxiety disorder  F41.1     2. Major depressive disorder, recurrent episode, mild (HCC)  F33.0       Plan: Patient is scheduled for follow-up; continue process work and developing coping skills.  STG between sessions to continue to reflect on her needs where career is concerned, and continue self advocacy efforts; continue with healthy self care choices; work to [employer] per strategies discussed.  Almarie ONEIDA Sprang, [facility]

## 2023-10-18 ENCOUNTER — Ambulatory Visit: Admitting: Professional Counselor

## 2023-10-27 ENCOUNTER — Ambulatory Visit: Admitting: Professional Counselor

## 2023-11-27 ENCOUNTER — Ambulatory Visit: Admitting: Professional Counselor

## 2023-12-25 ENCOUNTER — Encounter: Payer: Self-pay | Admitting: Professional Counselor

## 2023-12-25 ENCOUNTER — Ambulatory Visit: Admitting: Professional Counselor

## 2023-12-25 DIAGNOSIS — F4323 Adjustment disorder with mixed anxiety and depressed mood: Secondary | ICD-10-CM | POA: Diagnosis not present

## 2023-12-25 NOTE — Progress Notes (Signed)
 [facility]       [facility] Counselor/Therapist Progress Note  Patient ID: Desiree Pham, MRN: [id number],    Date: 12/25/2023  Time Spent: 11:15 AM to 12:20 PM  Treatment Type: Individual Therapy  Reported Symptoms: Stress, worries, anxiousness, some irritability, some low mood, fatigue, interpersonal concerns, phase of life concerns  Mental Status Exam:  Appearance:   Neat     Behavior:  Appropriate and Sharing  Motor:  Normal  Speech/Language:   Clear and Coherent and Normal Rate  Affect:  Appropriate and Congruent  Mood:  normal  Thought process:  normal  Thought content:    WNL  Sensory/Perceptual disturbances:    WNL  Orientation:  oriented to person, place, time/date, and situation  Attention:  Good  Concentration:  Good  Memory:  WNL  Fund of knowledge:   Good  Insight:    Good  Judgment:   Good  Impulse Control:  Good   Risk Assessment: Danger to Self:  No Self-injurious Behavior: No Danger to Others: No Duty to Warn:no Physical Aggression / Violence:No  Access to Firearms a concern: No  Gang Involvement:No   Subjective: Patient presented to session to address concerns of anxiety and depression.  She reported mixed progress at this time.  She processed experience of a job change, and positivity to this development.  Counselor celebrated development with patient and reinforced patient positivity.  Patient processed experience of increase in husband behavior patterns of concern, and ways she is coping.  Counselor helped facilitate insight into stress factors and pathways of adaptive and less productive release.  Counselor and patient discussed strategies around her concern, including leaning into his needs to foster reciprocity, disengaging from unhelpful banter, being attentive to micro shifts of presence, prioritize quality time as a couple, and practice attunement/fostering positive and healthy mirror neurons.  Interventions: Solution-Oriented/Positive Psychology,  Humanistic/Existential, and Insight-Oriented, Psychoeducation  Diagnosis:   ICD-10-CM   1. Adjustment disorder with mixed anxiety and depressed mood  F43.23       Plan: Patient is scheduled for follow-up; continue process work and developing coping skills.  Patient short-term goals between sessions to practice strategies discussed above, including as relates interpersonal effectiveness and grounding techniques.  Almarie ONEIDA Sprang, [facility]

## 2024-01-17 ENCOUNTER — Ambulatory Visit: Admitting: Professional Counselor

## 2024-02-05 ENCOUNTER — Ambulatory Visit: Admitting: Professional Counselor

## 2024-03-04 ENCOUNTER — Ambulatory Visit: Admitting: Professional Counselor

## 2024-04-01 ENCOUNTER — Ambulatory Visit: Admitting: Professional Counselor

## 2024-05-02 ENCOUNTER — Ambulatory Visit: Admitting: Professional Counselor
